# Patient Record
Sex: Male | Born: 1995 | Race: White | Hispanic: No | Marital: Single | Smoking: Never smoker
Health system: Southern US, Community
[De-identification: ages and names within clinical notes are randomized; demographics above are authoritative.]

## PROBLEM LIST (undated history)

## (undated) DIAGNOSIS — F845 Asperger's syndrome: Secondary | ICD-10-CM

## (undated) HISTORY — PX: ABDOMINAL SURGERY: SHX537

---

## 2017-06-18 ENCOUNTER — Emergency Department (HOSPITAL_COMMUNITY)
Admission: EM | Admit: 2017-06-18 | Discharge: 2017-06-18 | Disposition: A | Discharge status: Home / Self Care | Attending: Emergency Medicine | Admitting: Emergency Medicine

## 2017-06-18 ENCOUNTER — Encounter (HOSPITAL_COMMUNITY): Payer: Self-pay

## 2017-06-18 ENCOUNTER — Emergency Department (HOSPITAL_COMMUNITY)

## 2017-06-18 DIAGNOSIS — F845 Asperger's syndrome: Secondary | ICD-10-CM | POA: Insufficient documentation

## 2017-06-18 DIAGNOSIS — F341 Dysthymic disorder: Secondary | ICD-10-CM | POA: Diagnosis not present

## 2017-06-18 DIAGNOSIS — R222 Localized swelling, mass and lump, trunk: Secondary | ICD-10-CM | POA: Diagnosis present

## 2017-06-18 DIAGNOSIS — M954 Acquired deformity of chest and rib: Secondary | ICD-10-CM | POA: Diagnosis not present

## 2017-06-18 HISTORY — DX: Asperger's syndrome: F84.5

## 2017-06-18 NOTE — ED Notes (Signed)
Patient transported to X-ray 

## 2017-06-18 NOTE — ED Provider Notes (Signed)
[facility]-EMERGENCY DEPT Provider Note   CSN: [id number] Arrival date & time: 06/18/17  1422     History   Chief Complaint Chief Complaint  Patient presents with  . Knot on chest    HPI Reginald Ross is a 21 y.o. male.  HPI Patient presents was a deformity of his right chest wall. Has been they're likely for months. Patient's family member states she just noticed it. Does not hurt normally but does remember depressed. Patient denies any trauma. No difficult breathing. No fevers. Patient told me there was no trauma but reportedly had fallen onto his chest previously. Past Medical History:  Diagnosis Date  . Asperger syndrome     There are no active problems to display for this patient.   Past Surgical History:  Procedure Laterality Date  . ABDOMINAL SURGERY     muscle surgery as an infant       Home Medications    Prior to Admission medications   Medication Sig Start Date End Date Taking? Authorizing Provider  anastrozole (ARIMIDEX) 1 MG tablet Take 1 mg by mouth daily.   Yes [provider]    Family History No family history on file.  Social History Social History  Substance Use Topics  . Smoking status: Not on file  . Smokeless tobacco: Not on file  . Alcohol use Not on file     Allergies   Patient has no allergy information on record.   Review of Systems Review of Systems  Constitutional: Negative for appetite change.  Respiratory: Negative for shortness of breath and wheezing.   Cardiovascular: Negative for chest pain, palpitations and leg swelling.  Gastrointestinal: Negative for abdominal pain.  Skin: Negative for wound.     Physical Exam Updated Vital Signs BP 114/81 (BP Location: Right Arm)   Pulse 86   Temp 98.7 F (37.1 C) (Oral)   Resp 17   Ht 5\' 5"  (1.651 m)   Wt 53.5 kg (118 lb)   SpO2 96%   BMI 19.64 kg/m   Physical Exam  Constitutional: He appears well-developed.  HENT:  Head: Atraumatic.  Eyes: EOM are  normal.  Cardiovascular: Normal rate.   Pulmonary/Chest:  Prominence of the right parasternal area. May have some mild depression lateral to this. No crepitance. No subcutaneous emphysema. Not unstable. Slight abrasion possibly over the prominent area.  Abdominal: There is no tenderness.  Musculoskeletal: He exhibits no tenderness.  Neurological: He is alert.  Skin: Skin is warm.     ED Treatments / Results  Labs (all labs ordered are listed, but only abnormal results are displayed) Labs Reviewed - No data to display  EKG  EKG Interpretation None       Radiology Dg Chest 2 View  Result Date: 06/18/2017 CLINICAL DATA:  Status post fall on chest. Raised area in the right chest. EXAM: CHEST  2 VIEW COMPARISON:  None. FINDINGS: The heart size and mediastinal contours are within normal limits. Both lungs are clear. The visualized skeletal structures are unremarkable. IMPRESSION: No active cardiopulmonary disease. Electronically Signed   By: Sherian Rein M.D.   On: 06/18/2017 15:52   Dg Ribs Unilateral W/chest Right  Result Date: 06/18/2017 CLINICAL DATA:  Status post fall on chest. Raised area in the right chest. EXAM: RIGHT RIBS AND CHEST - 3+ VIEW COMPARISON:  None. FINDINGS: No fracture or other bone lesions are seen involving the ribs. There is no evidence of pneumothorax or pleural effusion. Both lungs are clear. Heart size and  mediastinal contours are within normal limits. IMPRESSION: Negative. Electronically Signed   By: Sherian ReinWei-Chen  Lin M.D.   On: 06/18/2017 15:53    Procedures Procedures (including critical care time)  Medications Ordered in ED Medications - No data to display   Initial Impression / Assessment and Plan / ED Course  I have reviewed the triage vital signs and the nursing notes.  Pertinent labs & imaging results that were available during my care of the patient were reviewed by me and considered in my medical decision making (see chart for details).      Patient with somewhat asymmetric bony prominence on his chest. X-ray reassuring. He is on some hormonal treatment to keep his growth plates open. Will need to follow-up with his PCP. Doubt acute change but does need following.  Final Clinical Impressions(s) / ED Diagnoses   Final diagnoses:  Chest wall deformity    New Prescriptions Discharge Medication List as of 06/18/2017  4:43 PM       Benjiman CorePickering, Kaylin Schellenberg, MD 06/19/17 0006

## 2017-06-18 NOTE — ED Notes (Signed)
Pt returned from xray. nad 

## 2017-06-18 NOTE — Discharge Instructions (Signed)
Follow-up with his doctor.

## 2017-06-18 NOTE — ED Triage Notes (Signed)
Pt and mother report raised area to right chest area. Mother noticed it this morning. Pt denies pain. No SOB. Breath sounds audible bilaterally. Pt reports he tripped on the stairs and fell on chest

## 2018-10-12 IMAGING — DX DG RIBS W/ CHEST 3+V*R*
2 series · 2 of 2 positions shown · non-contrast
Comparison: None.

CLINICAL DATA: Status post fall on chest. Raised area in the right
chest.

EXAM:
RIGHT RIBS AND CHEST - 3+ VIEW

[rib pa]
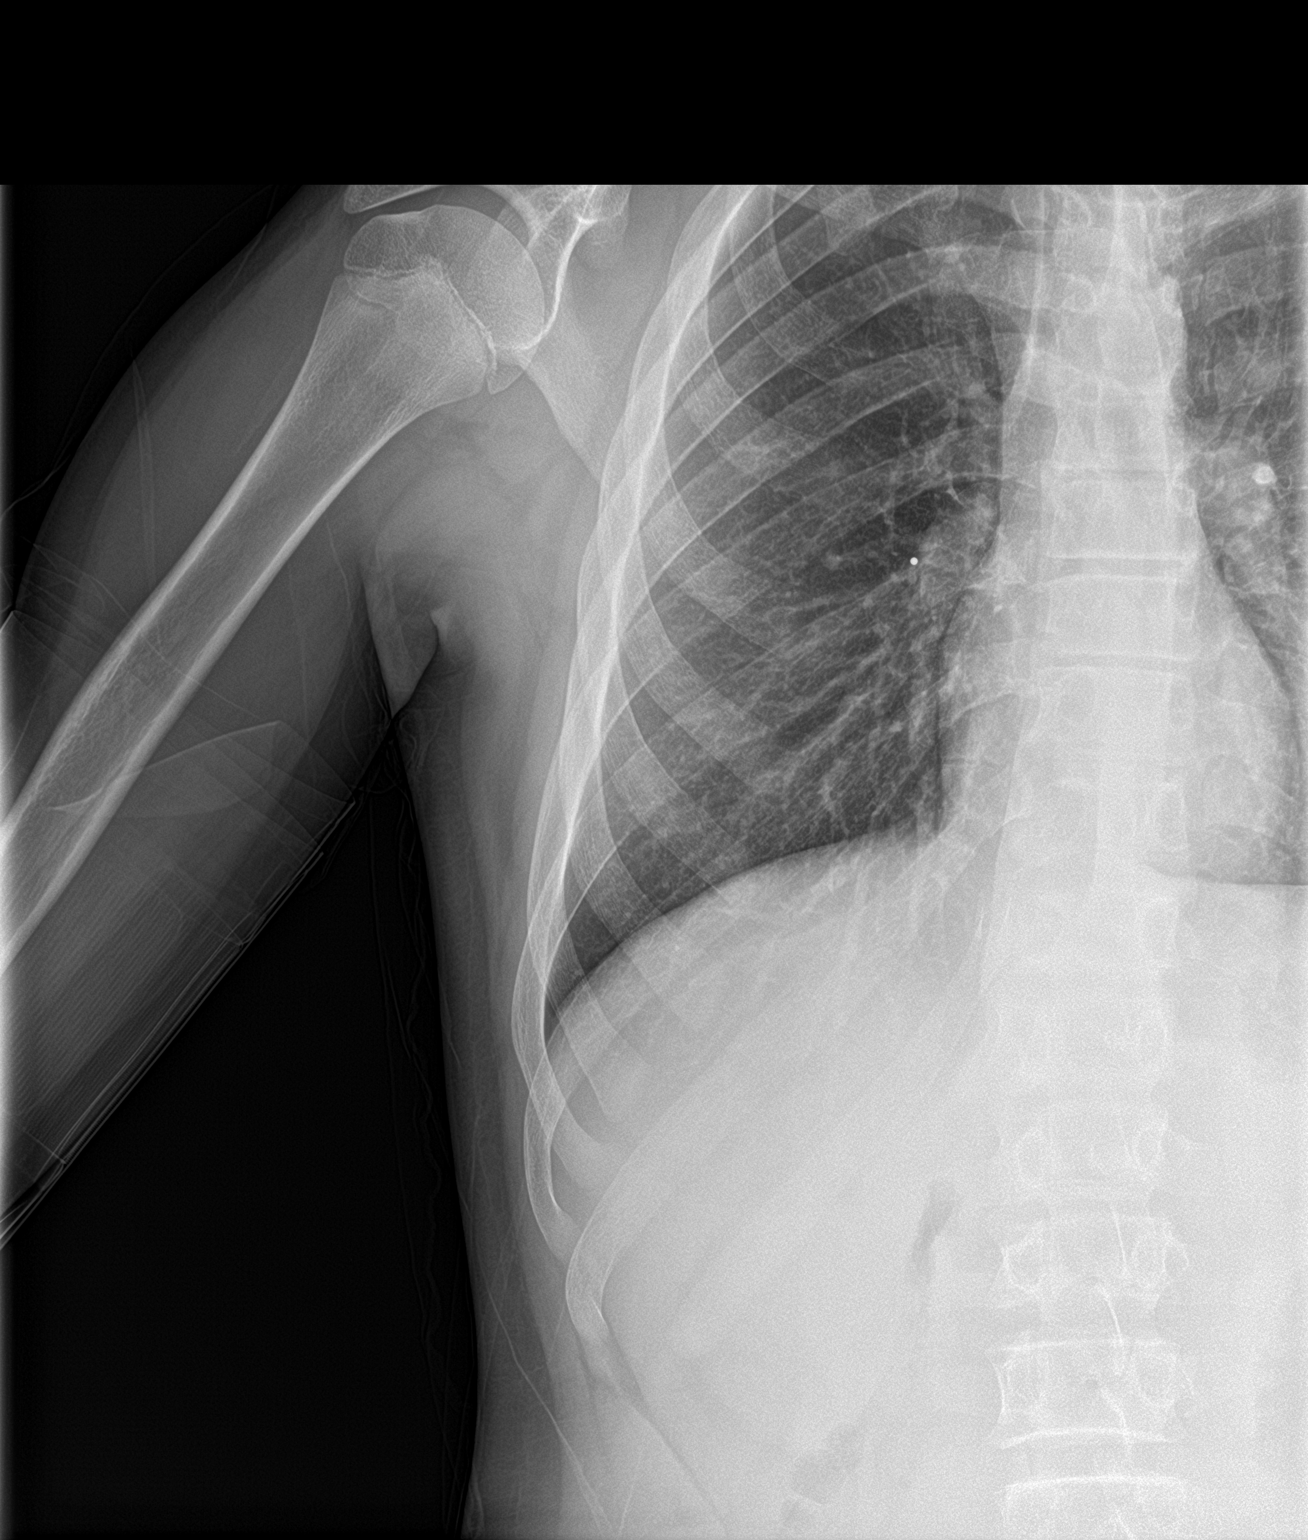

[rib pa obl]
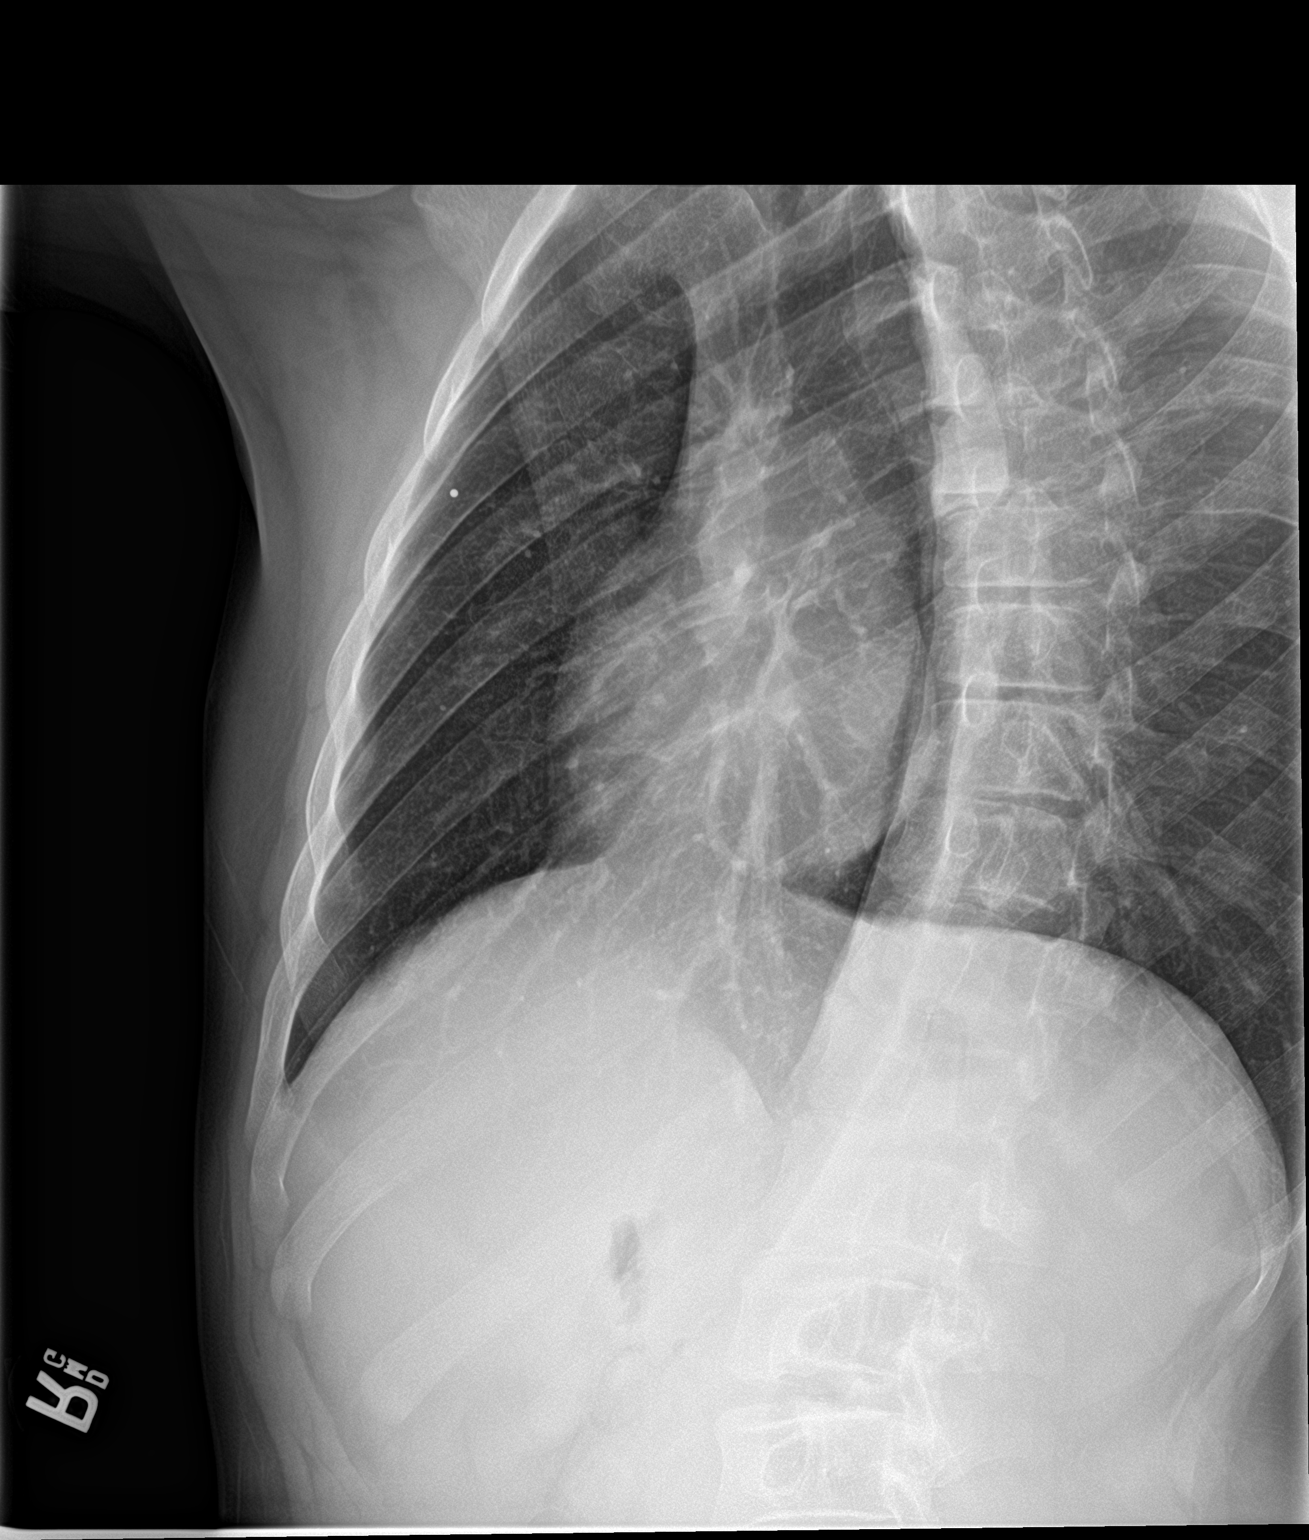

[2 of 2 positions shown; findings below may reference images not displayed]

FINDINGS: No fracture or other bone lesions are seen involving the ribs. There
is no evidence of pneumothorax or pleural effusion. Both lungs are
clear. Heart size and mediastinal contours are within normal limits.
IMPRESSION: Negative.

## 2020-11-09 ENCOUNTER — Encounter (HOSPITAL_COMMUNITY): Payer: Self-pay

## 2020-11-09 ENCOUNTER — Other Ambulatory Visit: Payer: Self-pay

## 2020-11-09 ENCOUNTER — Emergency Department (HOSPITAL_COMMUNITY): Payer: Medicaid Other

## 2020-11-09 ENCOUNTER — Emergency Department (HOSPITAL_COMMUNITY)
Admission: EM | Admit: 2020-11-09 | Discharge: 2020-11-09 | Disposition: A | Discharge status: Home / Self Care | Payer: Medicaid Other | Attending: Emergency Medicine | Admitting: Emergency Medicine

## 2020-11-09 DIAGNOSIS — W000XXA Fall on same level due to ice and snow, initial encounter: Secondary | ICD-10-CM | POA: Insufficient documentation

## 2020-11-09 DIAGNOSIS — S72424A Nondisplaced fracture of lateral condyle of right femur, initial encounter for closed fracture: Secondary | ICD-10-CM | POA: Insufficient documentation

## 2020-11-09 DIAGNOSIS — M25461 Effusion, right knee: Secondary | ICD-10-CM

## 2020-11-09 DIAGNOSIS — S79921A Unspecified injury of right thigh, initial encounter: Secondary | ICD-10-CM | POA: Diagnosis present

## 2020-11-09 MED ORDER — HYDROCODONE-ACETAMINOPHEN 5-325 MG PO TABS: 1.0000 | ORAL_TABLET | Freq: Four times a day (QID) | ORAL | 0 refills | Status: DC | PRN

## 2020-11-09 NOTE — ED Triage Notes (Signed)
Pt reports he slipped in ice this morning and hurt right knee

## 2020-11-09 NOTE — ED Provider Notes (Addendum)
[facility] EMERGENCY DEPARTMENT Provider Note   CSN: [id number] Arrival date & time: 11/09/20  1212     History Chief Complaint  Patient presents with  . Knee Pain    Reginald Ross is a 25 y.o. male presenting for evaluation of right knee pain and swelling from injury incurred when he slipped on ice this am.  He denies other injury including no head injury. Denies direct knee blow, but "twisted" it during the fall. He was able to bear weight initially but with substantial pain, pain now improved but with increased swelling.  He has employed and elevation prior to arrival.  Denies hip or ankle/foot pain, weakness or numbness.  Denies prior fracture or injuries to this knee.  HPI     Past Medical History:  Diagnosis Date  . Asperger syndrome     There are no problems to display for this patient.   Past Surgical History:  Procedure Laterality Date  . ABDOMINAL SURGERY     muscle surgery as an infant       No family history on file.     Home Medications Prior to Admission medications   Medication Sig Start Date End Date Taking? Authorizing Provider  HYDROcodone-acetaminophen (NORCO/VICODIN) 5-325 MG tablet Take 1 tablet by mouth every 6 (six) hours as needed for moderate pain. 11/09/20  Yes Daaiyah Baumert, Raynelle Fanning, PA-C  anastrozole (ARIMIDEX) 1 MG tablet Take 1 mg by mouth daily.    [provider]    Allergies    Patient has no known allergies.  Review of Systems   Review of Systems  Constitutional: Negative for fever.  Musculoskeletal: Positive for arthralgias and joint swelling. Negative for myalgias.  Neurological: Negative for weakness and numbness.  All other systems reviewed and are negative.   Physical Exam Updated Vital Signs BP (!) 141/88 (BP Location: Right Arm)   Pulse 97   Temp 98.2 F (36.8 C) (Oral)   Resp 18   Ht 5\' 8"  (1.727 m)   SpO2 99%   BMI 17.94 kg/m   Physical Exam Constitutional:      Appearance: He is well-developed and  well-nourished.  HENT:     Head: Atraumatic.  Cardiovascular:     Comments: Pulses equal bilaterally Musculoskeletal:        General: Swelling and tenderness present. No deformity.     Cervical back: Normal range of motion.     Right knee: Effusion and bony tenderness present. No erythema or ecchymosis.     Right lower leg: No edema.     Left lower leg: No edema.     Comments: ttp right lateral patella. No palpable deformity.  Moderate effusion.  Pt tolerates flexion to 90, passive extension to near full extension.  No crepitus. Skin intact. Ankle non tender including lateral malleolus.  Skin:    General: Skin is warm and dry.  Neurological:     Mental Status: He is alert.     Sensory: No sensory deficit.     Deep Tendon Reflexes: Strength normal. Reflexes normal.  Psychiatric:        Mood and Affect: Mood and affect normal.     ED Results / Procedures / Treatments   Labs (all labs ordered are listed, but only abnormal results are displayed) Labs Reviewed - No data to display  EKG None  Radiology DG Knee Complete 4 Views Right  Result Date: 11/09/2020 CLINICAL DATA:  Anterior right knee pain after fall EXAM: RIGHT KNEE - COMPLETE 4+ VIEW  COMPARISON:  None. FINDINGS: Large knee joint effusion. Contour deformity of the peripheral aspect of the lateral femoral condyle with suspected cortical fracture. Remaining osseous structures appear intact. Soft tissue swelling at the anterior aspect of the knee. IMPRESSION: 1. Contour deformity with suspected cortical fracture of the peripheral aspect of the lateral femoral condyle. Appearance and location raises suspicion suspicious of the transient patellar dislocation. 2. Large knee joint effusion, possibly hemarthrosis. Electronically Signed   By: Duanne Guess D.O.   On: 11/09/2020 14:21    Procedures Procedures   Medications Ordered in ED Medications - No data to display  ED Course  I have reviewed the triage vital signs and the  nursing notes.  Pertinent labs & imaging results that were available during my care of the patient were reviewed by me and considered in my medical decision making (see chart for details).    MDM Rules/Calculators/A&P                          Imaging reviewed and discussed with pt.  Discussed ice, elevation, strict immobilization and close f/u with Dr Romeo Apple. Placed in knee immobilizer, crutches provided.  Ibuprofen, hydrocodone prn. Pt not currently employed or working.  Final Clinical Impression(s) / ED Diagnoses Final diagnoses:  Closed nondisplaced fracture of lateral condyle of right femur, initial encounter (HCC)  Knee effusion, right    Rx / DC Orders ED Discharge Orders         Ordered    HYDROcodone-acetaminophen (NORCO/VICODIN) 5-325 MG tablet  Every 6 hours PRN        11/09/20 1501           IdolRaynelle Fanning, PA-C 11/09/20 1513    At dc, pt unable to tolerate using crutches, did not feel safe on them.  Walker prescription given.   Burgess Amor, PA-C 11/09/20 1746    Mancel Bale, MD 11/10/20 [phone]

## 2020-11-09 NOTE — Discharge Instructions (Signed)
As discussed, it appears you have fracture of the distal outer edge of your right femur bone.  This does not appear to be an injury that would require surgery but you will need to wear the Knee immobilizer at all times along with avoiding any weight bearing of this leg by using crutches until guided further by Dr. Romeo Apple.  Call his office for an appointment for close follow up of this injury.  In the interim,  avoid bending and flexing the knee at all times(the more it moves, the more the swelling will worsen).  Ice and ibuprofen will also help with swelling.  Take 3 ibuprofen tablets (600 mg total) every 8 hours with a snack.  You may take the hydrocodone  prescribed if needed for additional pain relief.  This will make you drowsy - do not drive within 4 hours of taking this medication.

## 2020-11-17 ENCOUNTER — Ambulatory Visit (INDEPENDENT_AMBULATORY_CARE_PROVIDER_SITE_OTHER): Payer: Medicaid Other | Admitting: Orthopedic Surgery

## 2020-11-17 ENCOUNTER — Other Ambulatory Visit: Payer: Self-pay

## 2020-11-17 ENCOUNTER — Encounter: Payer: Self-pay | Admitting: Orthopedic Surgery

## 2020-11-17 VITALS — BP 144/92 | HR 105 | Ht 68.0 in

## 2020-11-17 DIAGNOSIS — M25561 Pain in right knee: Secondary | ICD-10-CM

## 2020-11-17 DIAGNOSIS — M25461 Effusion, right knee: Secondary | ICD-10-CM | POA: Diagnosis not present

## 2020-11-17 DIAGNOSIS — W000XXA Fall on same level due to ice and snow, initial encounter: Secondary | ICD-10-CM

## 2020-11-17 DIAGNOSIS — S72424A Nondisplaced fracture of lateral condyle of right femur, initial encounter for closed fracture: Secondary | ICD-10-CM

## 2020-11-17 NOTE — Patient Instructions (Signed)
Ice 3 -4 x a day for 30 min   We will order MRI of the knee

## 2020-11-17 NOTE — Progress Notes (Signed)
NEW PROBLEM//OFFICE VISIT  Summary assessment and plan:   25 year old male with a knee effusion after a fall questionable fracture of his lateral femoral condyle  Recommend MRI to evaluate ligaments ACL PCL medial collateral ligament and medial meniscus  Chief Complaint  Patient presents with  . Knee Pain    Right     25 year old male slipped on ice around November 09, 2020 complained of immediate pain and swelling of his right knee.  X-rays were done in the ER possible fracture lateral condyle  Patient complains of pain and swelling decreased range of motion and now he has to use a walker   Review of Systems  All other systems reviewed and are negative.    Past Medical History:  Diagnosis Date  . Asperger syndrome     Past Surgical History:  Procedure Laterality Date  . ABDOMINAL SURGERY     muscle surgery as an infant    History reviewed. No pertinent family history. Social History   Tobacco Use  . Smoking status: Never Smoker  . Smokeless tobacco: Never Used    No Known Allergies  Current Meds  Medication Sig  . anastrozole (ARIMIDEX) 1 MG tablet Take 1 mg by mouth daily.  . citalopram (CELEXA) 10 MG tablet Take 10 mg by mouth daily.  Marland Kitchen HYDROcodone-acetaminophen (NORCO/VICODIN) 5-325 MG tablet Take 1 tablet by mouth every 6 (six) hours as needed for moderate pain.    BP (!) 144/92   Pulse (!) 105   Ht 5\' 8"  (1.727 m)   BMI 17.94 kg/m   Physical Exam Patient is here with his mother is awake alert and oriented mood and affect are normal Right knee exam Large effusion Range of motion arc is 60-90 Passive range of motion is 30-100 Drawer tests are equivocal because of pain and swelling Neurovascular exam is intact skin is normal    MEDICAL DECISION MAKING  A.  Encounter Diagnoses  Name Primary?  . Effusion, right knee Yes  . Acute pain of right knee   . Closed nondisplaced fracture of lateral condyle of right femur, initial encounter (HCC)      B. DATA ANALYSED:   IMAGING: Interpretation of images: External: 4 views right knee possible cortical break lateral femoral condyle near the insertion of the popliteus and collateral ligament  Orders: MRI right knee  Outside records reviewed: ER records   C. MANAGEMENT   Hinged knee brace weight-bear as tolerated with walker ice ibuprofen  Return after MRI  No orders of the defined types were placed in this encounter.     , MD  11/17/2020 12:39 PM

## 2020-11-22 ENCOUNTER — Other Ambulatory Visit: Payer: Self-pay | Admitting: Orthopedic Surgery

## 2020-11-22 DIAGNOSIS — M25561 Pain in right knee: Secondary | ICD-10-CM

## 2020-11-28 ENCOUNTER — Other Ambulatory Visit: Payer: Self-pay

## 2020-11-28 ENCOUNTER — Ambulatory Visit (HOSPITAL_COMMUNITY)
Admission: RE | Admit: 2020-11-28 | Discharge: 2020-11-28 | Disposition: A | Discharge status: Home / Self Care | Payer: Medicaid Other | Source: Ambulatory Visit | Attending: Orthopedic Surgery | Admitting: Orthopedic Surgery

## 2020-11-28 DIAGNOSIS — M25561 Pain in right knee: Secondary | ICD-10-CM | POA: Diagnosis present

## 2020-12-20 ENCOUNTER — Other Ambulatory Visit: Payer: Self-pay

## 2020-12-20 ENCOUNTER — Encounter: Payer: Self-pay | Admitting: Orthopedic Surgery

## 2020-12-20 ENCOUNTER — Ambulatory Visit (INDEPENDENT_AMBULATORY_CARE_PROVIDER_SITE_OTHER): Payer: Medicaid Other | Admitting: Orthopedic Surgery

## 2020-12-20 VITALS — Ht 68.0 in

## 2020-12-20 DIAGNOSIS — S83004D Unspecified dislocation of right patella, subsequent encounter: Secondary | ICD-10-CM | POA: Diagnosis not present

## 2020-12-20 NOTE — Progress Notes (Signed)
Chief Complaint  Patient presents with  . Knee Injury    Right 11/09/20    25 year old male slipped on ice around January 25th injured his right knee  He was sent for MRI secondary to initial images showing a possible fracture of the lateral condyle of the femur  So now we are 6 weeks post injury  Adriel has gotten significantly better he can move his knee freely but the knee has significantly decreased and.  He has some pain when he standing for long periods of time  The knee has significant less swelling about a range of motion  MRI personal interpretation after reviewing the images MRI images show impaction fractures patella and femur in the characteristic locations medially and laterally respectively with medial patellofemoral ligament thickening suggesting incomplete tearing  Currently his knee is stable he can stay in the brace for another 6 weeks ice at night follow-up in 6 weeks   Radiology official report IMPRESSION: 1. Sequelae of recent transient lateral patellar dislocation with associated impaction fractures of the medial patella and peripheral lateral femoral condyle. 2. Sprain of the MPFL and medial patellar retinaculum. 3. Small hemarthrosis.     Electronically Signed   By: Obie Dredge M.D.   On: 11/28/2020 15:39

## 2020-12-20 NOTE — Patient Instructions (Addendum)
IMPACTION FRACTURE PATELLA AND FEMUR AND PATELLA LIGAMENT INJURY   NO SURGERY NEEDED BUT 6 MORE WEEKS OF BRACE WEAR WHEN OUT OF THE HOUSE   ICE FOR 30 MIN AT NIGHT    Patellar Dislocation A kneecap (patella) becomes dislocated when the kneecap slips fully out of its normal position. This can cause pain and swelling. If the kneecap slips only partly out of its normal position, it is called patellar subluxation. Follow these instructions at home: If you have a brace:  Wear it as told by your doctor. Take it off only as told by your doctor.  Loosen the brace if your toes tingle, become numb, or turn cold and blue.  Do not let your brace get wet if it is not waterproof.  Keep the brace clean.  If your brace is not waterproof, cover it with a watertight covering when you take a bath or a shower.   Managing pain, stiffness, and swelling  If directed, put ice on the injured area. ? If you have a removable brace, take it off as told by your doctor. ? Put ice in a plastic bag. ? Place a towel between your skin and the bag. ? Leave the ice on for 20 minutes, 2-3 times a day.  Move your toes often to avoid stiffness and to lessen swelling. Activity  Go back to your normal activities as told by your doctor. Ask your doctor what activities are safe for you.  Do exercises as told by your doctor. General instructions  Do not use the injured limb to support your body weight until your doctor says that you can. Use crutches as told by your doctor.  Take over-the-counter and prescription medicines only as told by your doctor.  Keep all follow-up visits as told by your doctor. This is important. Prevention  Warm up and stretch before starting any physical activity.  Cool down and stretch after being active.  Give your body time to rest between periods of activity.  Make sure to use equipment that fits you.  Protect yourself against falls and injuries by doing activities in a safe  way. Contact a doctor if:  Your pain or swelling does not get better. Get help right away if:  The pain in your knee gets worse and is not helped by medicine.  You have more warmth or redness (inflammation) of the knee.  You have stiffness or your knee gets stuck (locks) in one position.  You cannot bend your knee.  You have new swelling, pain, or tenderness in any part of the affected leg. Summary  A kneecap becomes dislocated when the kneecap slips fully out of its normal position.  Icing, medicine, and using a knee brace may help. Follow instructions as told by your doctor. This information is not intended to replace advice given to you by your health care provider. Make sure you discuss any questions you have with your health care provider. Document Revised: 06/04/2020 Document Reviewed: 06/04/2020 Elsevier Patient Education  2021 [employer].

## 2021-02-03 ENCOUNTER — Ambulatory Visit: Payer: Medicaid Other | Admitting: Orthopedic Surgery

## 2021-02-03 ENCOUNTER — Ambulatory Visit (INDEPENDENT_AMBULATORY_CARE_PROVIDER_SITE_OTHER): Payer: Medicaid Other | Admitting: Orthopedic Surgery

## 2021-02-03 ENCOUNTER — Encounter: Payer: Self-pay | Admitting: Orthopedic Surgery

## 2021-02-03 ENCOUNTER — Other Ambulatory Visit: Payer: Self-pay

## 2021-02-03 VITALS — BP 119/77 | HR 84 | Ht 70.0 in | Wt 203.0 lb

## 2021-02-03 DIAGNOSIS — S83004D Unspecified dislocation of right patella, subsequent encounter: Secondary | ICD-10-CM | POA: Diagnosis not present

## 2021-02-03 NOTE — Progress Notes (Signed)
Chief Complaint  Patient presents with  . Knee Pain    Right s/p patella dislocation/ improving                   No complaints today  Physical Exam Constitutional:      Appearance: Normal appearance.  Musculoskeletal:        General: No swelling, tenderness or deformity.     Right lower leg: No edema.     Comments: Right knee  Neurological:     Mental Status: He is alert.    Ok to d/c   Brace as needed for heavy activity   Last visit: 25 year old male slipped on ice around January 25th injured his right knee   He was sent for MRI secondary to initial images showing a possible fracture of the lateral condyle of the femur   So now we are 6 weeks post injury   Oakes has gotten significantly better he can move his knee freely but the knee has significantly decreased and.  He has some pain when he standing for long periods of time   The knee has significant less swelling about a range of motion   MRI personal interpretation after reviewing the images MRI images show impaction fractures patella and femur in the characteristic locations medially and laterally respectively with medial patellofemoral ligament thickening suggesting incomplete tearing

## 2022-03-24 ENCOUNTER — Ambulatory Visit: Payer: Medicaid Other | Admitting: Family Medicine

## 2022-03-24 IMAGING — MR MR KNEE*R* W/O CM
6 series · 40 of 40 positions shown · non-contrast
Comparison: Right knee x-rays dated November 09, 2020.

CLINICAL DATA: Anterior right knee pain after a fall 3 weeks ago.

EXAM:
MRI OF THE RIGHT KNEE WITHOUT CONTRAST
TECHNIQUE: Multiplanar, multisequence MR imaging of the knee was performed. No
intravenous contrast was administered.

[Series 12: T2 fat-sat · axial · right · 4.0mm · 0.36mm/px · z∈[-148,-24]mm · 6 of 26 slices shown (1 of 3)]
[im 1/26]
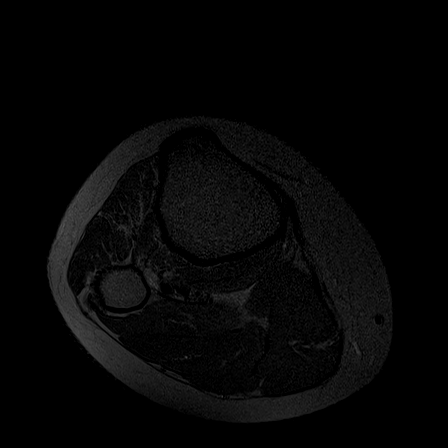
[im 6/26]
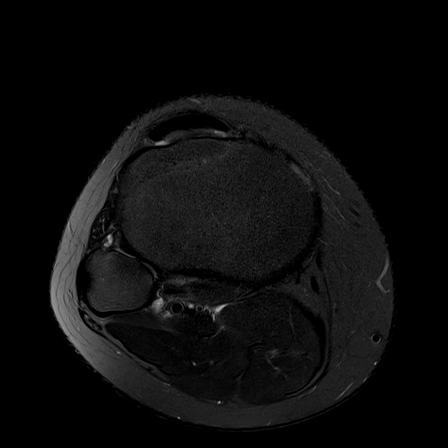
[im 11/26]
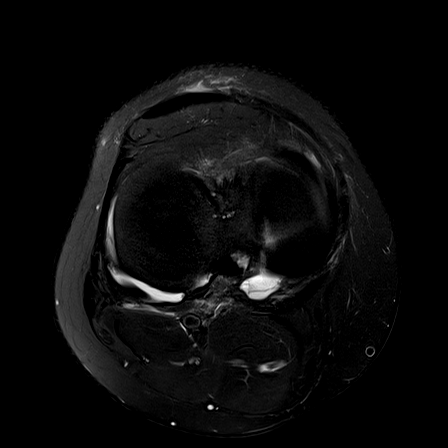
[im 16/26]
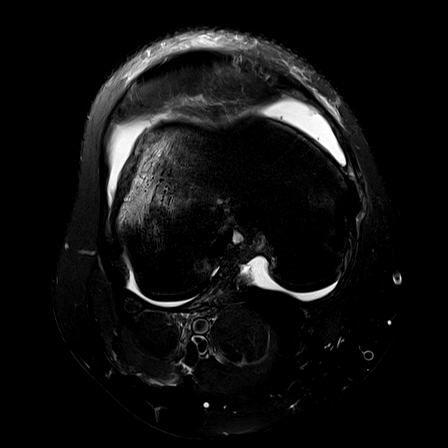
[im 21/26]
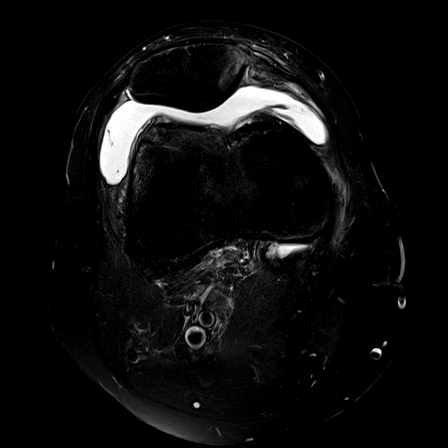
[im 26/26]
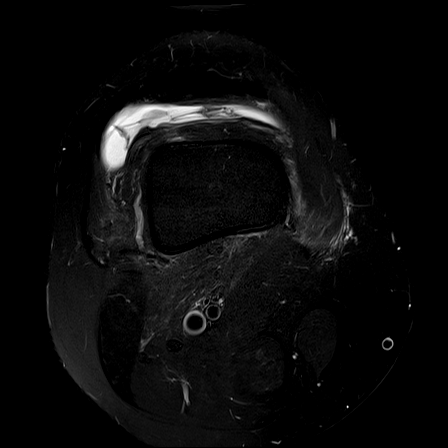

[Series 13: T1 · coronal · right · 4.0mm · 0.49mm/px · 7 of 30 slices shown]
[im 1/30]
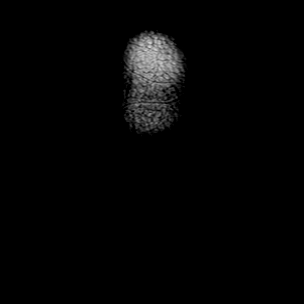
[im 5/30]
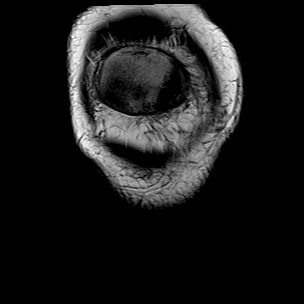
[im 10/30]
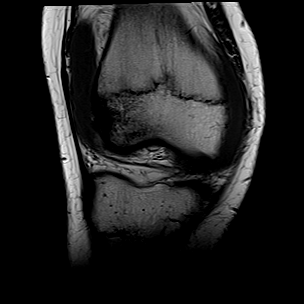
[im 15/30]
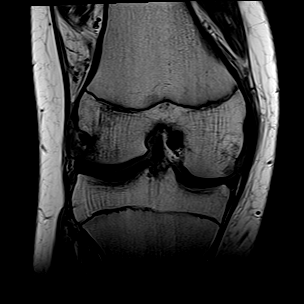
[im 20/30]
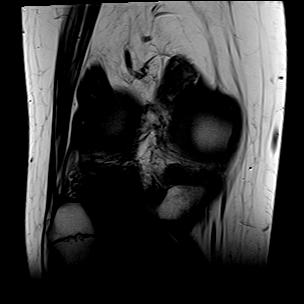
[im 25/30]
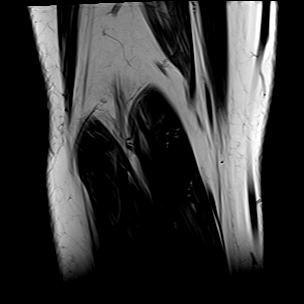
[im 30/30]
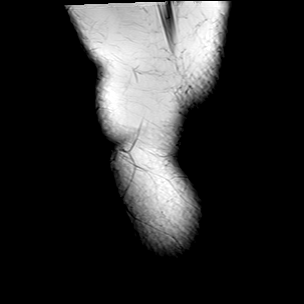

[Series 14: T2 fat-sat · coronal · right · 4.0mm · 0.59mm/px · 6 of 30 slices shown (2 of 3)]
[im 1/30]
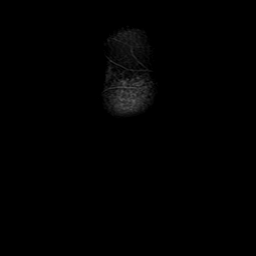
[im 6/30]
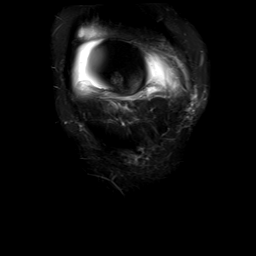
[im 12/30]
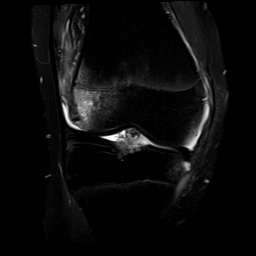
[im 18/30]
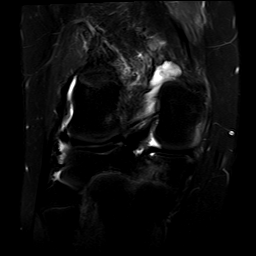
[im 24/30]
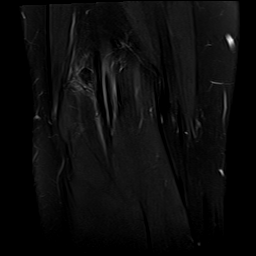
[im 30/30]
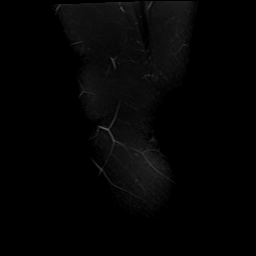

[Series 15: PD fat-sat · coronal · right · 3.0mm · 0.44mm/px · 7 of 36 slices shown (1 of 2)]
[im 1/36]
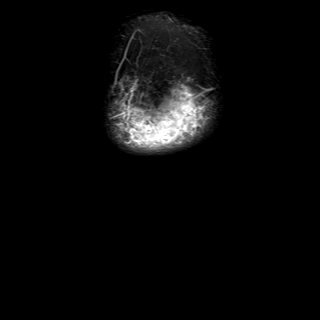
[im 6/36]
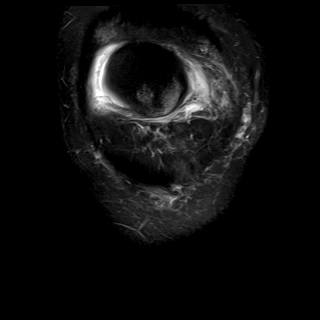
[im 12/36]
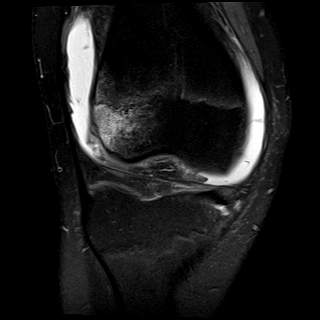
[im 18/36]
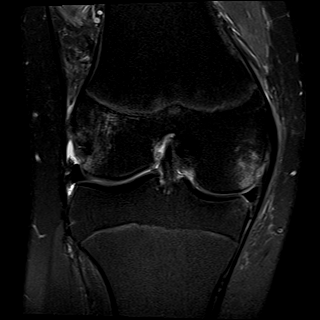
[im 24/36]
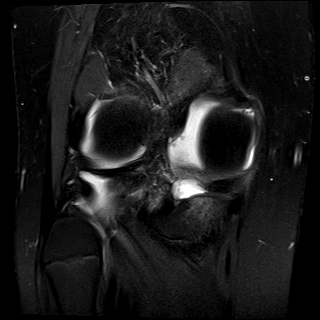
[im 30/36]
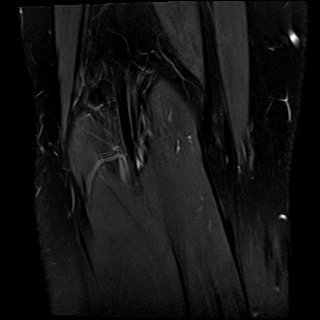
[im 36/36]
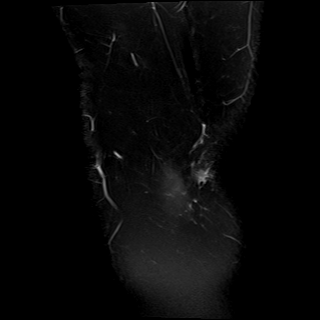

[Series 16: PD fat-sat · sagittal · right · 3.0mm · 0.52mm/px · 7 of 36 slices shown (2 of 2)]
[im 1/36]
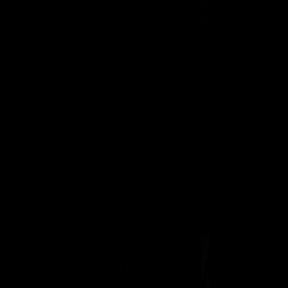
[im 6/36]
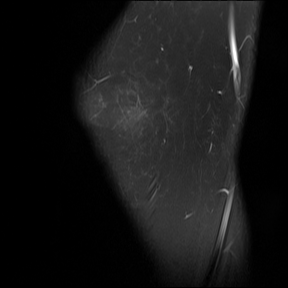
[im 12/36]
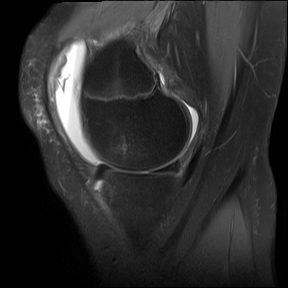
[im 18/36]
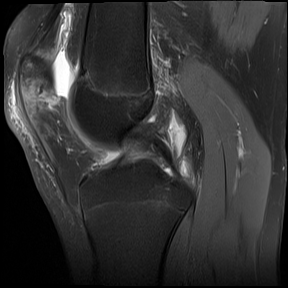
[im 24/36]
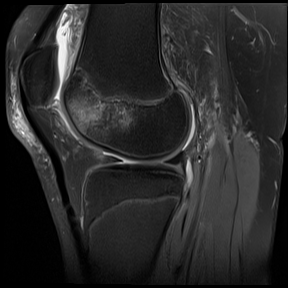
[im 30/36]
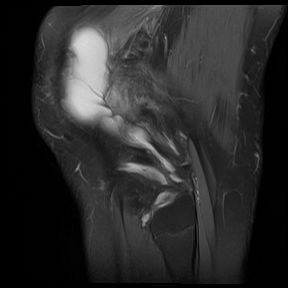
[im 36/36]
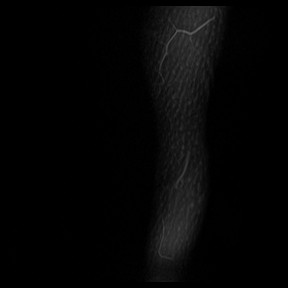

[Series 17: T2 fat-sat · sagittal · right · 3.0mm · 0.52mm/px · 7 of 36 slices shown (3 of 3)]
[im 1/36]
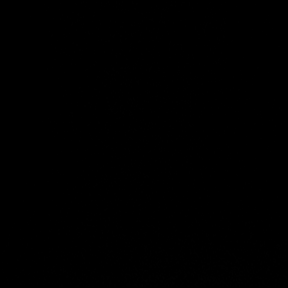
[im 6/36]
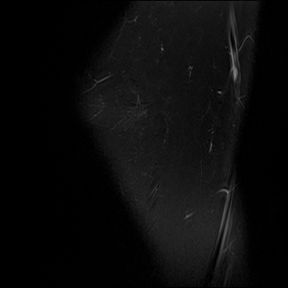
[im 12/36]
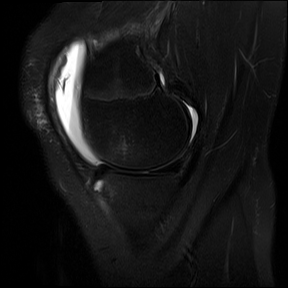
[im 18/36]
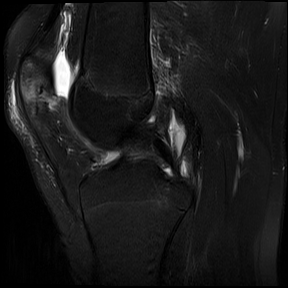
[im 24/36]
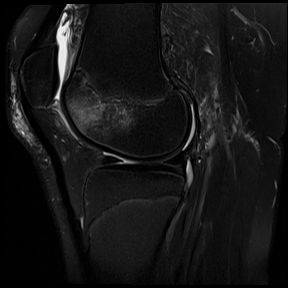
[im 30/36]
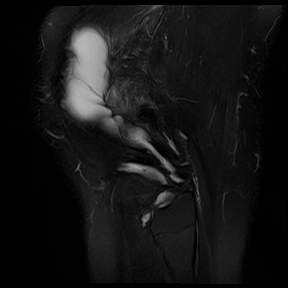
[im 36/36]
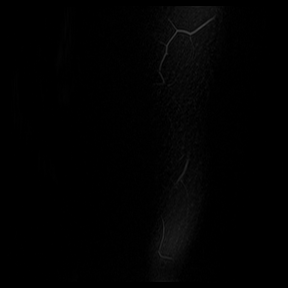

[40 of 40 positions shown; findings below may reference images not displayed]

FINDINGS: MENISCI

Medial meniscus:  Intact.

Lateral meniscus:  Intact.

LIGAMENTS

Cruciates:  Intact ACL and PCL.

Collaterals: Medial collateral ligament is intact. Lateral
collateral ligament complex is intact.

CARTILAGE

Patellofemoral: Focal full-thickness fissuring over the lateral
patellar facet (series 12, image 8).

Medial:  Normal.

Lateral:  Normal.

Joint: Small joint effusion with fluid-fluid level, consistent with
hemarthrosis. Edema in the superomedial aspect of Hoffa's fat.

Popliteal Fossa:  No Baker cyst. Intact popliteus tendon.

Extensor Mechanism: Intact quadriceps tendon and patellar tendon.
Thickening of the MPFL and medial patellar retinaculum with
increased intermediate signal and mild surrounding soft tissue
swelling. Intact lateral patellar retinaculum.

Bones: Acute impaction fractures of the peripheral lateral femoral
condyle and medial patella. Small contusions in the peripheral
medial femoral condyle and posterior medial tibial plateau. No
dislocation. No suspicious bone lesion.

Other: Mild prepatellar soft tissue swelling.
IMPRESSION: 1. Sequelae of recent transient lateral patellar dislocation with
associated impaction fractures of the medial patella and peripheral
lateral femoral condyle.
2. Sprain of the MPFL and medial patellar retinaculum.
3. Small hemarthrosis.

## 2022-03-30 ENCOUNTER — Telehealth: Payer: Self-pay | Admitting: *Deleted

## 2022-03-30 ENCOUNTER — Ambulatory Visit (INDEPENDENT_AMBULATORY_CARE_PROVIDER_SITE_OTHER): Payer: Medicaid Other | Admitting: Family Medicine

## 2022-03-30 DIAGNOSIS — F422 Mixed obsessional thoughts and acts: Secondary | ICD-10-CM

## 2022-03-30 DIAGNOSIS — F840 Autistic disorder: Secondary | ICD-10-CM

## 2022-03-30 MED ORDER — FLUOXETINE HCL 20 MG PO TABS: 20.0000 mg | ORAL_TABLET | Freq: Every day | ORAL | 0 refills | Status: DC

## 2022-03-30 NOTE — Patient Instructions (Signed)
Medication as directed.  Follow up in 6 weeks.  Take care  Dr. Adriana Simas

## 2022-03-30 NOTE — Telephone Encounter (Signed)
Medicaid will not cover Fluoxetine tablet but will cover Fluoxetine capsules- can the script be switched to capsules and resent to pharmacy- Please advise  [facility]

## 2022-03-31 DIAGNOSIS — F840 Autistic disorder: Secondary | ICD-10-CM | POA: Insufficient documentation

## 2022-03-31 DIAGNOSIS — F429 Obsessive-compulsive disorder, unspecified: Secondary | ICD-10-CM | POA: Insufficient documentation

## 2022-03-31 MED ORDER — FLUOXETINE HCL 20 MG PO CAPS: 20.0000 mg | ORAL_CAPSULE | Freq: Every day | ORAL | 0 refills | Status: DC

## 2022-03-31 NOTE — Telephone Encounter (Signed)
Everlene Other G, DO     Yes that is fine by me.

## 2022-03-31 NOTE — Assessment & Plan Note (Signed)
Patient is experiencing OCD.  Treating with fluoxetine.

## 2022-03-31 NOTE — Progress Notes (Signed)
Subjective:  Patient ID: Reginald Ross, male    DOB: 01/21/1996  Age: 26 y.o. MRN: [id number]  CC: Chief Complaint  Patient presents with   New Patient (Initial Visit)    Pt has anxiety issues    HPI:  26 year old male with a history of autism presents to establish care.  Patient states that he was encouraged to come to the doctor by his grandmother.  He lives with his grandmother.  He states that he has obsessive thoughts which are quite intrusive and interfere with his ability to do the things he likes.  He states that he will often have to repeat something until is done perfectly.  He states that he was told that this was anxiety.  He has been using Atarax without resolution.  He states that he is otherwise doing well.  He does state that these thoughts really bother him.  No other reported symptoms.  No other complaints.  Patient Active Problem List   Diagnosis Date Noted   Autism 03/31/2022   OCD (obsessive compulsive disorder) 03/31/2022    Social Hx   Social History   Socioeconomic History   Marital status: Single    Spouse name: Not on file   Number of children: Not on file   Years of education: Not on file   Highest education level: Not on file  Occupational History   Not on file  Tobacco Use   Smoking status: Never   Smokeless tobacco: Never  Substance and Sexual Activity   Alcohol use: Not on file   Drug use: Not on file   Sexual activity: Not on file  Other Topics Concern   Not on file  Social History Narrative   Not on file   Social Determinants of Health   Financial Resource Strain: Not on file  Food Insecurity: Not on file  Transportation Needs: Not on file  Physical Activity: Not on file  Stress: Not on file  Social Connections: Not on file    Review of Systems Per HPI  Objective:  BP 130/85   Pulse 68   Temp 97.6 F (36.4 C) (Oral)   Ht 5\' 10"  (1.778 m)   Wt 209 lb 9.6 oz (95.1 kg)   SpO2 100%   BMI 30.07 kg/m      03/30/2022    10:16 AM 02/03/2021    8:01 AM 11/17/2020    9:43 AM  BP/Weight  Systolic BP 130 119 144  Diastolic BP 85 77 92  Wt. (Lbs) 209.6 203   BMI 30.07 kg/m2 29.13 kg/m2     Physical Exam Vitals and nursing note reviewed.  Constitutional:      Appearance: He is obese.  HENT:     Head: Normocephalic and atraumatic.  Eyes:     General:        Right eye: No discharge.        Left eye: No discharge.     Conjunctiva/sclera: Conjunctivae normal.  Cardiovascular:     Rate and Rhythm: Normal rate and regular rhythm.     Heart sounds: No murmur heard. Pulmonary:     Effort: Pulmonary effort is normal.     Breath sounds: Normal breath sounds. No wheezing, rhonchi or rales.  Neurological:     General: No focal deficit present.     Mental Status: He is alert.    Assessment & Plan:   Problem List Items Addressed This Visit       Other   Autism  OCD (obsessive compulsive disorder)    Patient is experiencing OCD.  Treating with fluoxetine.      Relevant Medications   hydrOXYzine (ATARAX) 25 MG tablet    Follow-up:  Return in about 6 weeks (around 05/11/2022).  Everlene Other DO [facility]

## 2022-03-31 NOTE — Telephone Encounter (Signed)
New script sent electronically to pharmacy.

## 2022-04-03 ENCOUNTER — Telehealth: Payer: Self-pay

## 2022-04-03 NOTE — Telephone Encounter (Signed)
Pharmacy updated.

## 2022-04-03 NOTE — Telephone Encounter (Signed)
Did you mean to send this to [facility]? Patient is not seen by anyone at this office.

## 2022-04-03 NOTE — Telephone Encounter (Signed)
Caller name:Reginald Ross   On DPR? :Yes  Call back [phone]  Provider they see: Adriana Simas  Reason for call:Pt is calling updating pharmacy to [facility]

## 2022-05-22 ENCOUNTER — Encounter: Payer: Self-pay | Admitting: Family Medicine

## 2022-05-22 ENCOUNTER — Ambulatory Visit (INDEPENDENT_AMBULATORY_CARE_PROVIDER_SITE_OTHER): Payer: Medicaid Other | Admitting: Family Medicine

## 2022-05-22 DIAGNOSIS — F422 Mixed obsessional thoughts and acts: Secondary | ICD-10-CM

## 2022-05-22 MED ORDER — FLUOXETINE HCL 40 MG PO CAPS: 40.0000 mg | ORAL_CAPSULE | Freq: Every day | ORAL | 1 refills | Status: DC

## 2022-05-22 NOTE — Patient Instructions (Signed)
I have increased your medication.  Follow up in 3 months.  Take care  Dr. Adriana Simas

## 2022-05-22 NOTE — Assessment & Plan Note (Signed)
Seems to be improved but patient states he is still having difficulty.  Increasing Prozac.

## 2022-05-22 NOTE — Progress Notes (Signed)
   Subjective:  Patient ID: Reginald Ross, male    DOB: 1995-10-28  Age: 26 y.o. MRN: [id number]  CC: Chief Complaint  Patient presents with   Autism    Pt arrives for follow up. Pt states he is still repeating things in head for some reason.     HPI:  26 year old male with OCD presents for follow up.  Patient states he still has obsessive thoughts. Has had a slight improvement. However, clinically he does not seem to be as bother/flustered about these thoughts today. Compliant with Prozac. No reported side effects. No other complaints at this time.  Patient Active Problem List   Diagnosis Date Noted   Autism 03/31/2022   OCD (obsessive compulsive disorder) 03/31/2022    Social Hx   Social History   Socioeconomic History   Marital status: Single    Spouse name: Not on file   Number of children: Not on file   Years of education: Not on file   Highest education level: Not on file  Occupational History   Not on file  Tobacco Use   Smoking status: Never   Smokeless tobacco: Never  Substance and Sexual Activity   Alcohol use: Not on file   Drug use: Not on file   Sexual activity: Not on file  Other Topics Concern   Not on file  Social History Narrative   Not on file   Social Determinants of Health   Financial Resource Strain: Not on file  Food Insecurity: Not on file  Transportation Needs: Not on file  Physical Activity: Not on file  Stress: Not on file  Social Connections: Not on file    Review of Systems Per HPI  Objective:  BP 124/83   Pulse 78   Temp 97.9 F (36.6 C)   Wt 209 lb 6.4 oz (95 kg)   SpO2 99%   BMI 30.05 kg/m      05/22/2022   10:24 AM 03/30/2022   10:16 AM 02/03/2021    8:01 AM  BP/Weight  Systolic BP 124 130 119  Diastolic BP 83 85 77  Wt. (Lbs) 209.4 209.6 203  BMI 30.05 kg/m2 30.07 kg/m2 29.13 kg/m2    Physical Exam Vitals and nursing note reviewed.  Constitutional:      General: He is not in acute distress.     Appearance: Normal appearance. He is obese. He is not ill-appearing.  HENT:     Head: Normocephalic and atraumatic.  Pulmonary:     Effort: Pulmonary effort is normal. No respiratory distress.  Neurological:     Mental Status: He is alert.  Psychiatric:        Mood and Affect: Mood normal.        Behavior: Behavior normal.     Assessment & Plan:   Problem List Items Addressed This Visit       Other   OCD (obsessive compulsive disorder)    Seems to be improved but patient states he is still having difficulty.  Increasing Prozac.       Relevant Medications   FLUoxetine (PROZAC) 40 MG capsule    Meds ordered this encounter  Medications   FLUoxetine (PROZAC) 40 MG capsule    Sig: Take 1 capsule (40 mg total) by mouth daily.    Dispense:  90 capsule    Refill:  1    Follow-up:  Return in about 3 months (around 08/22/2022).  Everlene Other DO [facility]

## 2022-08-22 ENCOUNTER — Encounter: Payer: Self-pay | Admitting: Family Medicine

## 2022-08-22 ENCOUNTER — Ambulatory Visit (INDEPENDENT_AMBULATORY_CARE_PROVIDER_SITE_OTHER): Payer: Medicaid Other | Admitting: Family Medicine

## 2022-08-22 VITALS — BP 132/84 | HR 78 | Wt 206.8 lb

## 2022-08-22 DIAGNOSIS — F422 Mixed obsessional thoughts and acts: Secondary | ICD-10-CM | POA: Diagnosis not present

## 2022-08-22 MED ORDER — FLUOXETINE HCL 40 MG PO CAPS: 80.0000 mg | ORAL_CAPSULE | Freq: Every day | ORAL | 1 refills | Status: DC

## 2022-08-22 NOTE — Assessment & Plan Note (Signed)
Still having difficulty.  Uncontrolled.  Increasing Prozac.  Advised to follow-up with [facility].  Recommended counseling to help deal with obsessive/intrusive thoughts.

## 2022-08-22 NOTE — Patient Instructions (Signed)
Continue seeing [facility].  I have increased to the Prozac to help.  Follow up in 6 moths.

## 2022-08-22 NOTE — Progress Notes (Signed)
Subjective:  Patient ID: Reginald Ross, male    DOB: Feb 07, 1996  Age: 26 y.o. MRN: [id number]  CC: Chief Complaint  Patient presents with   Follow-up    Pt arrives for follow up. Pt states he is getting nervous on his phone again.     HPI:  26 year old male with autism and OCD presents for follow-up.  Patient is now seeing [facility].  He states that he was given some medication for anxiety.  I believe that he is referring to Atarax.  Continues to take Prozac as prescribed for OCD.  He continues to have obsessive thoughts particular involving 1 video game on his phone.  This is quite troubling and distressing to him.  Patient Active Problem List   Diagnosis Date Noted   Autism 03/31/2022   OCD (obsessive compulsive disorder) 03/31/2022    Social Hx   Social History   Socioeconomic History   Marital status: Single    Spouse name: Not on file   Number of children: Not on file   Years of education: Not on file   Highest education level: Not on file  Occupational History   Not on file  Tobacco Use   Smoking status: Never   Smokeless tobacco: Never  Substance and Sexual Activity   Alcohol use: Not on file   Drug use: Not on file   Sexual activity: Not on file  Other Topics Concern   Not on file  Social History Narrative   Not on file   Social Determinants of Health   Financial Resource Strain: Not on file  Food Insecurity: Not on file  Transportation Needs: Not on file  Physical Activity: Not on file  Stress: Not on file  Social Connections: Not on file    Review of Systems Per HPI  Objective:  BP 132/84   Pulse 78   Wt 206 lb 12.8 oz (93.8 kg)   SpO2 99%   BMI 29.67 kg/m      08/22/2022    9:55 AM 05/22/2022   10:24 AM 03/30/2022   10:16 AM  BP/Weight  Systolic BP 301 601 093  Diastolic BP 84 83 85  Wt. (Lbs) 206.8 209.4 209.6  BMI 29.67 kg/m2 30.05 kg/m2 30.07 kg/m2    Physical Exam Vitals and nursing note reviewed.  Constitutional:       General: He is not in acute distress.    Appearance: Normal appearance. He is obese.  HENT:     Head: Normocephalic and atraumatic.  Cardiovascular:     Rate and Rhythm: Normal rate and regular rhythm.  Pulmonary:     Effort: Pulmonary effort is normal. No respiratory distress.  Neurological:     Mental Status: He is alert.      Assessment & Plan:   Problem List Items Addressed This Visit       Other   OCD (obsessive compulsive disorder) - Primary    Still having difficulty.  Uncontrolled.  Increasing Prozac.  Advised to follow-up with [facility].  Recommended counseling to help deal with obsessive/intrusive thoughts.      Relevant Medications   FLUoxetine (PROZAC) 40 MG capsule    Meds ordered this encounter  Medications   FLUoxetine (PROZAC) 40 MG capsule    Sig: Take 2 capsules (80 mg total) by mouth daily.    Dispense:  180 capsule    Refill:  1    Follow-up:  Return in about 6 months (around 02/20/2023).  [place]

## 2023-01-22 ENCOUNTER — Other Ambulatory Visit: Payer: Self-pay | Admitting: Family Medicine

## 2023-02-15 ENCOUNTER — Ambulatory Visit (INDEPENDENT_AMBULATORY_CARE_PROVIDER_SITE_OTHER): Payer: Medicaid Other | Admitting: Family Medicine

## 2023-02-15 ENCOUNTER — Encounter: Payer: Self-pay | Admitting: Family Medicine

## 2023-02-15 VITALS — BP 104/67 | HR 70 | Temp 97.5°F | Ht 70.0 in | Wt 199.0 lb

## 2023-02-15 DIAGNOSIS — F422 Mixed obsessional thoughts and acts: Secondary | ICD-10-CM

## 2023-02-15 NOTE — Patient Instructions (Signed)
I am placing a referral so that I can get a specialist to help me manage your symptoms.  Continue your medication.

## 2023-02-15 NOTE — Progress Notes (Signed)
   Subjective:  Patient ID: Gohan Collister, male    DOB: 08-02-1996  Age: 27 y.o. MRN: [id number]  CC: Chief Complaint  Patient presents with   mixed obsessional thoughts and acts    6 month follow up- no concerns voiced    HPI:  27 year old male presents for follow-up.  Continues to have obsessive thoughts and repetitive acts.  This is mostly centered on his videogames.  He has now started washing his hands repeatedly.  He endorses compliance with Prozac.  He has not had significant improvement.  Patient Active Problem List   Diagnosis Date Noted   Autism 03/31/2022   OCD (obsessive compulsive disorder) 03/31/2022    Social Hx   Social History   Socioeconomic History   Marital status: Single    Spouse name: Not on file   Number of children: Not on file   Years of education: Not on file   Highest education level: Not on file  Occupational History   Not on file  Tobacco Use   Smoking status: Never   Smokeless tobacco: Never  Substance and Sexual Activity   Alcohol use: Not on file   Drug use: Not on file   Sexual activity: Not on file  Other Topics Concern   Not on file  Social History Narrative   Not on file   Social Determinants of Health   Financial Resource Strain: Not on file  Food Insecurity: Not on file  Transportation Needs: Not on file  Physical Activity: Not on file  Stress: Not on file  Social Connections: Not on file    Review of Systems Per HPI  Objective:  BP 104/67   Pulse 70   Temp (!) 97.5 F (36.4 C)   Ht 5\' 10"  (1.778 m)   Wt 199 lb (90.3 kg)   SpO2 97%   BMI 28.55 kg/m      02/15/2023    8:32 AM 08/22/2022    9:55 AM 05/22/2022   10:24 AM  BP/Weight  Systolic BP 104 132 124  Diastolic BP 67 84 83  Wt. (Lbs) 199 206.8 209.4  BMI 28.55 kg/m2 29.67 kg/m2 30.05 kg/m2    Physical Exam Vitals and nursing note reviewed.  Constitutional:      Appearance: Normal appearance. He is obese.  HENT:     Head: Normocephalic and  atraumatic.  Cardiovascular:     Rate and Rhythm: Normal rate and regular rhythm.  Pulmonary:     Effort: Pulmonary effort is normal. No respiratory distress.  Neurological:     Mental Status: He is alert.      Assessment & Plan:   Problem List Items Addressed This Visit       Other   OCD (obsessive compulsive disorder) - Primary    Continue Prozac.  Referring to psychiatry.      Relevant Orders   Ambulatory referral to Psychiatry   Follow-up:  Return in about 6 months (around 08/18/2023).  Everlene Other DO [facility]

## 2023-02-15 NOTE — Assessment & Plan Note (Signed)
Continue Prozac.  Referring to psychiatry.

## 2023-04-17 ENCOUNTER — Other Ambulatory Visit: Payer: Self-pay | Admitting: Family Medicine

## 2023-05-07 ENCOUNTER — Ambulatory Visit (HOSPITAL_COMMUNITY): Payer: MEDICAID | Admitting: Psychiatry

## 2023-06-14 ENCOUNTER — Encounter (HOSPITAL_COMMUNITY): Payer: Self-pay | Admitting: Psychiatry

## 2023-06-14 ENCOUNTER — Ambulatory Visit (INDEPENDENT_AMBULATORY_CARE_PROVIDER_SITE_OTHER): Payer: MEDICAID | Admitting: Psychiatry

## 2023-06-14 DIAGNOSIS — F410 Panic disorder [episodic paroxysmal anxiety] without agoraphobia: Secondary | ICD-10-CM

## 2023-06-14 DIAGNOSIS — G4726 Circadian rhythm sleep disorder, shift work type: Secondary | ICD-10-CM

## 2023-06-14 DIAGNOSIS — Z789 Other specified health status: Secondary | ICD-10-CM

## 2023-06-14 DIAGNOSIS — F840 Autistic disorder: Secondary | ICD-10-CM | POA: Diagnosis not present

## 2023-06-14 DIAGNOSIS — F321 Major depressive disorder, single episode, moderate: Secondary | ICD-10-CM | POA: Diagnosis not present

## 2023-06-14 DIAGNOSIS — F422 Mixed obsessional thoughts and acts: Secondary | ICD-10-CM | POA: Diagnosis not present

## 2023-06-14 DIAGNOSIS — F411 Generalized anxiety disorder: Secondary | ICD-10-CM | POA: Diagnosis not present

## 2023-06-14 MED ORDER — SERTRALINE HCL 100 MG PO TABS: ORAL_TABLET | ORAL | 1 refills | Status: DC

## 2023-06-14 MED ORDER — FLUOXETINE HCL 40 MG PO CAPS: 40.0000 mg | ORAL_CAPSULE | Freq: Every day | ORAL | Status: DC

## 2023-06-14 MED ORDER — FLUOXETINE HCL 20 MG PO CAPS: 20.0000 mg | ORAL_CAPSULE | Freq: Every day | ORAL | 0 refills | Status: DC

## 2023-06-14 NOTE — Progress Notes (Signed)
Psychiatric Initial Adult Assessment  Patient Identification: Reginald Ross MRN:  [id number] Date of Evaluation:  06/14/2023 Referral Source: PCP  Assessment:  Reginald Ross is a 27 y.o. male with a history of autism, OCD, generalized anxiety disorder with panic attacks, caffeine overuse, shift phase insomnia with snoring, major depressive disorder, possible learning disability who presents to [facility] via video conferencing for initial evaluation of OCD and anxiety.  Patient reported struggling in middle school and being in special classes to help him keep up with his schoolwork when in high school.  Carried a diagnosis of autism spectrum which appeared consistent on initial exam.  With that in mind OCD is highly comorbid in this population and over the last year to year and a half starting in 2022 the patient had worsening of sessions with more and more intricate compulsions that he had to follow as outlined in HPI of initial assessment on 06/14/2023.  Fluoxetine appeared to be helpful for anxiety in combination with hydroxyzine but per patient and patient's mother had not led to significant change in OCD symptoms.  Carefully outlined plan change from fluoxetine to sertraline with grandmother present as patient had significant difficulty in following instructions as outlined.  He had previously been trialed on citalopram as well so do not imagine that monotherapy with sertraline will be fully effective and will plan on adding either Abilify or risperidone which has decent data in OCD and autism populations.  He had significant caffeine overuse but as above struggles to understand how to cut back so this may be a long-term discussion.  Encouraged coordinating with [employer] [facility] [place] for further behavioral resources as he was not involved in psychotherapy which will also be necessary to address his OCD.  Would classify the major depression as single episode in the  context of developing the OCD and can get quite extreme when low frustration tolerance causes him to have thoughts of dying but never has intent or plan with this.  Will coordinate with PCP for baseline blood work.  Follow-up in 1 month.  For safety, his acute risk factors for suicide are: Diagnosis of OCD and depression, and intermittent thoughts of suicide.  His chronic risk factors for suicide are: Unemployment, chronic mental illness, firearms in the home.  His protective factors are: Supportive family, no intent or plan with suicidal ideation, actively seeking and engaging with mental health care, does not know how to access to firearms in the home, hope for the future.  While future events cannot be fully predicted he does not currently meet IBC criteria and can be continued as an outpatient.  Plan:  # Autism spectrum disorder with OCD Past medication trials: Fluoxetine, citalopram, hydroxyzine Status of problem: New to provider Interventions: -- Cross-taper fluoxetine to 40 mg once daily for 1 week.  Then 20 mg once daily for 1 week then discontinue.  -- Once off fluoxetine start sertraline 50 mg once daily for 1 week then increase to 100 mg once daily with plan to titrate further -- Patient to contact the [employer] [place] for further behavioral resources and psychotherapy --We will likely plan on either risperidone or Abilify in the future to augment  # Generalized anxiety disorder with panic attacks  caffeine overuse Past medication trials:  Status of problem:  New to provider Interventions: -- Fluoxetine, sertraline, psychotherapy as above --Continue hydroxyzine 25 mg twice daily as needed for panic --Patient to cut back on caffeine use  # Major  depressive disorder, single episode, moderate with intermittent passive suicidal ideation Past medication trials:  Status of problem:  New to provider Interventions: -- Fluoxetine, sertraline, psychotherapy as above  #  Shift phase sleep disorder Past medication trials:  Status of problem:  New to provider Interventions: -- Patient to cut back on caffeine --CBT-I  Patient was given contact information for behavioral health clinic and was instructed to call 911 for emergencies.   Subjective:  Chief Complaint:  Chief Complaint  Patient presents with   Anxiety   OCD   Establish Care    History of Present Illness:  Has been having OCD trouble. Anxiety problems have been going on for awhile and do seem to be more controllable. Finds he can't play his games until he does the OCD, describes having to follow noise patterns and will have to start over if he messes it up.   Lives with his grandmother in [place]. Worked at [employer] but then COVID happened. Likes video games, [employer], can't watch videos anymore because of the OCD. Has been awhile since has been able to enjoy, up to a year or 1.5 years since he has been able to watch a video. Trouble falling asleep, will go to bed at 2a and sleep for 6hrs but if no one wakes him up can sleep 12hrs at a time. Snores but no sleep study. Restless legs. Drinks 3-4 sodas per day, bottles. Will drink tea when at his mother's and will have a coffee at [facility]. Used to drink a monster daily. Snacks throughout the day on junk food and will have a meal if going out to eat. No binges. Is a picky eater so if food he wants is not present will not eat it. No purging. Concentration is adequate. Imagines cartoon characters or WWE stars doing different things. Fidgety. By the time high school came did better, had special classes to help catch up. Can struggle with guilt feelings when anxiety starts; has been learning to control. SI has come up in the setting of OCD stress but no plans or intent.   Chronic worry across multiple domains with impact on sleep and muscle tension. Panic attacks happen daily. Compulsions can be rearranging, taking off clothes in certain order,  fidgeting with pillow, turning on electronics, washing hands repeatedly, re-reading things. Obsessions will be hurting himself or killing himself if he doesn't comply with the compulsions. Still denies plan or intent when these occur. Denies sleeplessness greater than 1 day. No hallucinations. Sometimes paranoid that people look at him.   No alcohol or cigarettes but has tried. No other drugs.   Past Psychiatric History:  Diagnoses: OCD, autism Medication trials: fluoxetine (ineffective at 80mg  daily), hydroxyzine (effective), citalopram (ineffective) Previous psychiatrist/therapist: yes to both Hospitalizations: none Suicide attempts: none SIB: sometimes will slap head in frustration Hx of violence towards others: none Current access to guns: yes secured in gun safe but doesn't know how to access Hx of trauma/abuse: none  Previous Psychotropic Medications: Yes   Substance Abuse History in the last 12 months:  No.  Past Medical History:  Past Medical History:  Diagnosis Date   Asperger syndrome     Past Surgical History:  Procedure Laterality Date   ABDOMINAL SURGERY     muscle surgery as an infant    Family Psychiatric History: none known  Family History:  Family History  Family history unknown: Yes    Social History:   Academic/Vocational: none currently, used to work at [employer]  General  Social History   Socioeconomic History   Marital status: Single    Spouse name: Not on file   Number of children: Not on file   Years of education: Not on file   Highest education level: Not on file  Occupational History   Not on file  Tobacco Use   Smoking status: Never   Smokeless tobacco: Never   Tobacco comments:    Tried smoking once but did not like it  Substance and Sexual Activity   Alcohol use: Not Currently    Comment: Tried once and did not like it   Drug use: Never   Sexual activity: Not on file  Other Topics Concern   Not on file  Social History Narrative    Not on file   Social Determinants of Health   Financial Resource Strain: Not on file  Food Insecurity: Not on file  Transportation Needs: Not on file  Physical Activity: Not on file  Stress: Not on file  Social Connections: Not on file    Additional Social History: updated  Allergies:  No Known Allergies  Current Medications: Current Outpatient Medications  Medication Sig Dispense Refill   FLUoxetine (PROZAC) 20 MG capsule Take 1 capsule (20 mg total) by mouth daily. After finishing 40mg  daily for 1 week then discontinue. 7 capsule 0   sertraline (ZOLOFT) 100 MG tablet Take half a tablet daily for one week after finishing fluoxetine 20mg  daily. Then increase to 1 full tablet daily thereafter. 30 tablet 1   FLUoxetine (PROZAC) 40 MG capsule Take 1 capsule (40 mg total) by mouth daily for 7 days. Then discontinue.     hydrOXYzine (ATARAX) 25 MG tablet Take 25 mg by mouth 2 (two) times daily as needed.     No current facility-administered medications for this visit.    ROS: Review of Systems  Constitutional:  Positive for appetite change. Negative for unexpected weight change.  Gastrointestinal:  Negative for constipation, diarrhea, nausea and vomiting.  Endocrine: Positive for heat intolerance. Negative for cold intolerance and polyphagia.  Musculoskeletal:  Negative for arthralgias, back pain and myalgias.  Skin:        No hair loss  Neurological:  Positive for headaches.       Dizziness from pacing in circles  Psychiatric/Behavioral:  Positive for dysphoric mood, sleep disturbance and suicidal ideas. Negative for decreased concentration, hallucinations and self-injury. The patient is nervous/anxious.     Objective:  Psychiatric Specialty Exam: There were no vitals taken for this visit.There is no height or weight on file to calculate BMI.  General Appearance: Casual, Fairly Groomed, and wearing a hat.  Appears stated age  Eye Contact:  Fair  Speech:  Clear and  Coherent and pressured but interruptible.  Slight impairment to articulation  Volume:  Normal  Mood:   "I have been dealing with anxiety and OCD"  Affect:  Appropriate, Congruent, and overall elevated but primarily anxiety  Thought Content: Logical, Hallucinations: None, and Obsessions as outlined in HPI  Suicidal Thoughts:   Intermittent as outlined in HPI none in session today.  Homicidal Thoughts:  No  Thought Process:  Descriptions of Associations: Tangential  Orientation:  Full (Time, Place, and Person)    Memory: Grossly intact   Judgment:  Other:  Limited  Insight:   Limited  Concentration:  Concentration: Poor and Attention Span: Poor  Recall:  not formally assessed   Fund of Knowledge: Poor  Language: Fair  Psychomotor Activity:  Increased and Restlessness  Akathisia:  No  AIMS (if indicated): not done  Assets:  Communication Skills Desire for Improvement Financial Resources/Insurance Housing Leisure Time Physical Health Resilience Social Support Talents/Skills Transportation  ADL's:  Impaired  Cognition: Cannot rule out impairment  Sleep:  Poor   PE: General: sits comfortably in view of camera; no acute distress though does get frustrated easily Pulm: no increased work of breathing on room air  MSK: all extremity movements appear intact  Neuro: no focal neurological deficits observed  Gait & Station: unable to assess by video    Metabolic Disorder Labs: No results found for: "HGBA1C", "MPG" No results found for: "PROLACTIN" No results found for: "CHOL", "TRIG", "HDL", "CHOLHDL", "VLDL", "LDLCALC" No results found for: "TSH"  Therapeutic Level Labs: No results found for: "LITHIUM" No results found for: "CBMZ" No results found for: "VALPROATE"  Screenings:  PHQ2-9    Flowsheet Row Office Visit from 06/14/2023 in [facility] at [facility] Total Score 5  PHQ-9 Total Score 12      Flowsheet Row Office Visit from  06/14/2023 in [facility] at [place] [facility] from 11/09/2020 in [facility] at [facility]  C-SSRS RISK CATEGORY Low Risk No Risk       Collaboration of Care: Collaboration of Care: Medication Management AEB as above, Primary Care Provider AEB as above, and Referral or follow-up with counselor/therapist AEB as above  Patient/Guardian was advised Release of Information must be obtained prior to any record release in order to collaborate their care with an outside provider. Patient/Guardian was advised if they have not already done so to contact the registration department to sign all necessary forms in order for [place] to release information regarding their care.   Consent: Patient/Guardian gives verbal consent for treatment and assignment of benefits for services provided during this visit. Patient/Guardian expressed understanding and agreed to proceed.   Televisit via video: I connected with [name] on 06/14/23 at  9:00 AM EDT by a video enabled telemedicine application and verified that I am speaking with the correct person using two identifiers.  Location: Patient: [facility] Provider: home office   I discussed the limitations of evaluation and management by telemedicine and the availability of in person appointments. The patient expressed understanding and agreed to proceed.  I discussed the assessment and treatment plan with the patient. The patient was provided an opportunity to ask questions and all were answered. The patient agreed with the plan and demonstrated an understanding of the instructions.   The patient was advised to call back or seek an in-person evaluation if the symptoms worsen or if the condition fails to improve as anticipated.  I provided 60 minutes dedicated to the care of this patient via video on the date of this encounter to include chart review, face-to-face time with the patient,  medication management/counseling, coordination of care with primary care provider, gaining collateral from patient's grandmother.  Elsie Lincoln, MD 8/29/202412:12 PM

## 2023-06-14 NOTE — Patient Instructions (Addendum)
We decreased the fluoxetine to 40 mg once daily today.  Take this for 1 week then decrease to 20 mg once daily for 1 week.  Then discontinue.  Once you have discontinued the fluoxetine you can start the sertraline at 50 mg (half a tablet) once daily for 1 week.  Then increase to a full tablet daily thereafter.  When he get a chance please contact the [employer] [place] for further behavioral resources and they may be able to connect you with a therapist as well: [email or web address]  I would also recommend cutting back caffeine by half a unit (the unit would be either a bottle, cup, can) every 5 days until you are drinking 1 unit of caffeine daily no later than 12 PM.  I will also coordinate with your primary care provider to get some blood work.

## 2023-07-07 ENCOUNTER — Other Ambulatory Visit (HOSPITAL_COMMUNITY): Payer: Self-pay | Admitting: Psychiatry

## 2023-07-07 DIAGNOSIS — F422 Mixed obsessional thoughts and acts: Secondary | ICD-10-CM

## 2023-07-07 DIAGNOSIS — F410 Panic disorder [episodic paroxysmal anxiety] without agoraphobia: Secondary | ICD-10-CM

## 2023-07-07 DIAGNOSIS — F321 Major depressive disorder, single episode, moderate: Secondary | ICD-10-CM

## 2023-07-16 ENCOUNTER — Telehealth (INDEPENDENT_AMBULATORY_CARE_PROVIDER_SITE_OTHER): Payer: MEDICAID | Admitting: Psychiatry

## 2023-07-16 ENCOUNTER — Encounter (HOSPITAL_COMMUNITY): Payer: Self-pay | Admitting: Psychiatry

## 2023-07-16 DIAGNOSIS — F840 Autistic disorder: Secondary | ICD-10-CM | POA: Diagnosis not present

## 2023-07-16 DIAGNOSIS — F321 Major depressive disorder, single episode, moderate: Secondary | ICD-10-CM

## 2023-07-16 DIAGNOSIS — F422 Mixed obsessional thoughts and acts: Secondary | ICD-10-CM | POA: Diagnosis not present

## 2023-07-16 DIAGNOSIS — F411 Generalized anxiety disorder: Secondary | ICD-10-CM | POA: Diagnosis not present

## 2023-07-16 DIAGNOSIS — G4726 Circadian rhythm sleep disorder, shift work type: Secondary | ICD-10-CM

## 2023-07-16 DIAGNOSIS — F410 Panic disorder [episodic paroxysmal anxiety] without agoraphobia: Secondary | ICD-10-CM

## 2023-07-16 DIAGNOSIS — Z789 Other specified health status: Secondary | ICD-10-CM

## 2023-07-16 MED ORDER — SERTRALINE HCL 100 MG PO TABS: 200.0000 mg | ORAL_TABLET | Freq: Every day | ORAL | 2 refills | Status: DC

## 2023-07-16 NOTE — Patient Instructions (Signed)
We increased his Zoloft (sertraline) to 200 mg once daily.  Your best to try and cut back on caffeine and that should make it easier to sleep at night and give you less anxiety.

## 2023-07-16 NOTE — Progress Notes (Signed)
BH MD Outpatient Progress Note  07/16/2023 11:47 AM [name]  MRN:  [id number]  Assessment:  Reginald Ross presents for follow-up evaluation. Today, 07/16/23, patient reports worsening of OCD obsessive thoughts and compulsive behaviors in the setting of switching from fluoxetine to sertraline.  This was to be expected and thankfully patient tolerated the switch well with plan to titrate as outlined in plan below.  Aggressive dosing schedule planned as OCD frequently requires higher doses of SSRI.  We will similarly have plan for addition of either Abilify or Risperdal once at stable dose of sertraline.  Patient's grandmother still helping monitor medication and will try to connect him with autism Society of [facility] resources.  Interestingly, he did not report suicidal ideation since the switch so hopefully some early benefit of Zoloft being noted. He is not involved in psychotherapy which will also be necessary to address his OCD.  Still struggling with concept of cutting back on caffeine.  Follow-up in 1 month.   For safety, his acute risk factors for suicide are: Diagnosis of OCD and depression, and intermittent thoughts of suicide.  His chronic risk factors for suicide are: Unemployment, chronic mental illness, firearms in the home.  His protective factors are: Supportive family, no intent or plan with suicidal ideation, actively seeking and engaging with mental health care, does not know how to access to firearms in the home, hope for the future.  While future events cannot be fully predicted he does not currently meet IVC criteria and can be continued as an outpatient.  Identifying Information: Reginald Ross is a 27 y.o. male with a history of autism, OCD, generalized anxiety disorder with panic attacks, caffeine overuse, shift phase insomnia with snoring, major depressive disorder, possible learning disability who is an established patient with [facility].  Initial evaluation of OCD and anxiety on 06/14/23; please see that note for full case formulation.  Patient reported struggling in middle school and being in special classes to help him keep up with his schoolwork when in high school.  Carried a diagnosis of autism spectrum which appeared consistent on initial exam.  With that in mind OCD is highly comorbid in this population and over the last year to year and a half starting in 2022 the patient had worsening of sessions with more and more intricate compulsions that he had to follow as outlined in HPI of initial assessment on 06/14/2023.  Fluoxetine appeared to be helpful for anxiety in combination with hydroxyzine but per patient and patient's mother had not led to significant change in OCD symptoms.  Carefully outlined plan change from fluoxetine to sertraline with grandmother present as patient had significant difficulty in following instructions as outlined.  He had previously been trialed on citalopram as well so do not imagine that monotherapy with sertraline will be fully effective and will plan on adding either Abilify or risperidone which has decent data in OCD and autism populations.  He had significant caffeine overuse but as above struggles to understand how to cut back so this may be a long-term discussion.  Would classify the major depression as single episode in the context of developing the OCD and can get quite extreme when low frustration tolerance causes him to have thoughts of dying but never has intent or plan with this.  Will coordinate with PCP for baseline blood work.     Plan:   # Autism spectrum disorder with OCD Past medication trials: Fluoxetine, citalopram, hydroxyzine Status of problem: Chronic with  moderate exacerbation Interventions: -- Titrate sertraline to 200 mg once daily (i9/30/24) -- Patient to contact the autism Society of [place] for further behavioral resources and psychotherapy --We will likely plan on either  risperidone or Abilify in the future to augment   # Generalized anxiety disorder with panic attacks  caffeine overuse Past medication trials:  Status of problem: Chronic with moderate exacerbation Interventions: -- sertraline, psychotherapy as above --Continue hydroxyzine 25 mg twice daily as needed for panic --Patient to cut back on caffeine use   # Major depressive disorder, single episode, moderate with intermittent passive suicidal ideation Past medication trials:  Status of problem: Improving Interventions: -- sertraline, psychotherapy as above   # Shift phase sleep disorder Past medication trials:  Status of problem: Chronic and stable Interventions: -- Patient to cut back on caffeine --CBT-I  Patient was given contact information for behavioral health clinic and was instructed to call 911 for emergencies.   Subjective:  Chief Complaint:  Chief Complaint  Patient presents with   OCD   Autism   Anxiety   Depression   Follow-up    Interval History: At the point where he is wanting to play his playstation again after beating one game. Did note worsening of OCD symptoms with the switch with making repeated noise patterns and pacing, says he is going from a 4 to an 11. No side effects of GI symptoms or headaches and was amenable to further titration today. Denies having SI when directly asked (several responses were nonsensical to questions asked). Still having excessive caffeine intake with 3 Dr. Pat Kocher daily. Didn't contact autism society.  Brings grandmother in for last part of visit. Confirms he was able to make the switch to zoloft. They will try to connect with autism society of [place]; he does have a transportation limitation. Notes he also stays up all night playing video games and reviewed cutting back on caffeine.   Visit Diagnosis:    ICD-10-CM   1. Autism  F84.0     2. Mixed obsessional thoughts and acts  F42.2 sertraline (ZOLOFT) 100 MG tablet    3.  Generalized anxiety disorder with panic attacks  F41.1 sertraline (ZOLOFT) 100 MG tablet   F41.0     4. Major depressive disorder, single episode, moderate (HCC)  F32.1 sertraline (ZOLOFT) 100 MG tablet    5. Caffeine overuse  Z78.9     6. 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  G47.26       Past Psychiatric History:  Diagnoses: autism, OCD, generalized anxiety disorder with panic attacks, caffeine overuse, shift phase insomnia with snoring, major depressive disorder, possible learning disability Medication trials: fluoxetine (ineffective at 80mg  daily), hydroxyzine (effective), citalopram (ineffective) Previous psychiatrist/therapist: yes to both Hospitalizations: none Suicide attempts: none SIB: sometimes will slap head in frustration Hx of violence towards others: none Current access to guns: yes secured in gun safe but doesn't know how to access Hx of trauma/abuse: none Substance use: none  Past Medical History:  Past Medical History:  Diagnosis Date   Asperger syndrome     Past Surgical History:  Procedure Laterality Date   ABDOMINAL SURGERY     muscle surgery as an infant    Family Psychiatric History: none known   Family History:  Family History  Family history unknown: Yes    Social History:  Academic/Vocational: none currently, used to work at [employer]   Social History   Socioeconomic History   Marital status: Single  Spouse name: Not on file   Number of children: Not on file   Years of education: Not on file   Highest education level: Not on file  Occupational History   Not on file  Tobacco Use   Smoking status: Never   Smokeless tobacco: Never   Tobacco comments:    Tried smoking once but did not like it  Substance and Sexual Activity   Alcohol use: Not Currently    Comment: Tried once and did not like it   Drug use: Never   Sexual activity: Not on file  Other Topics Concern   Not on file  Social History Narrative   Not  on file   Social Determinants of Health   Financial Resource Strain: Not on file  Food Insecurity: Not on file  Transportation Needs: Not on file  Physical Activity: Not on file  Stress: Not on file  Social Connections: Not on file    Allergies: No Known Allergies  Current Medications: Current Outpatient Medications  Medication Sig Dispense Refill   hydrOXYzine (ATARAX) 25 MG tablet Take 25 mg by mouth 2 (two) times daily as needed.     sertraline (ZOLOFT) 100 MG tablet Take 2 tablets (200 mg total) by mouth daily. 60 tablet 2   No current facility-administered medications for this visit.    ROS: Review of Systems  Constitutional:  Positive for appetite change. Negative for unexpected weight change.  Gastrointestinal:  Negative for constipation, diarrhea, nausea and vomiting.  Endocrine: Positive for heat intolerance. Negative for cold intolerance and polyphagia.  Musculoskeletal:  Negative for arthralgias, back pain and myalgias.  Neurological:  Positive for headaches.  Psychiatric/Behavioral:  Positive for dysphoric mood and sleep disturbance. Negative for decreased concentration, hallucinations, self-injury and suicidal ideas. The patient is nervous/anxious.        Obsessive thoughts and compulsive behaviors    Objective:  Psychiatric Specialty Exam: There were no vitals taken for this visit.There is no height or weight on file to calculate BMI.  General Appearance: Casual, Fairly Groomed, and wearing a hat.  Appears stated age  Eye Contact:  Minimal  Speech:   Slight impairment to articulation.  Slight stutter.  Fast rate but interruptible  Volume:  Increased  Mood:   "It did get worse"  Affect:  Appropriate, Congruent, and anxious.  Spontaneous smile  Thought Content: Logical, Hallucinations: None, Obsessions, and Rumination   Suicidal Thoughts:   Denies any in session today  Homicidal Thoughts:  No  Thought Process:  Descriptions of Associations: Tangential,  concrete  Orientation:  Full (Time, Place, and Person)    Memory:  Grossly intact   Judgment:  Other:  Chronically limited  Insight:   Chronically limited  Concentration:  Concentration: Poor  Recall:  not formally assessed   Fund of Knowledge: Poor  Language: Poor  Psychomotor Activity:  Increased  Akathisia:  No  AIMS (if indicated): not done  Assets:  Communication Skills Desire for Improvement Financial Resources/Insurance Housing Leisure Time Physical Health Social Support Talents/Skills  ADL's:  Impaired  Cognition: WNL  Sleep:  Poor   PE: General: sits comfortably in view of camera; no acute distress  Pulm: no increased work of breathing on room air  MSK: all extremity movements appear intact  Neuro: no focal neurological deficits observed  Gait & Station: unable to assess by video    Metabolic Disorder Labs: No results found for: "HGBA1C", "MPG" No results found for: "PROLACTIN" No results found for: "CHOL", "TRIG", "HDL", "  CHOLHDL", "VLDL", "LDLCALC" No results found for: "TSH"  Therapeutic Level Labs: No results found for: "LITHIUM" No results found for: "VALPROATE" No results found for: "CBMZ"  Screenings:  PHQ2-9    Flowsheet Row Office Visit from 06/14/2023 in [facility] at [facility] Total Score 5  PHQ-9 Total Score 12      Flowsheet Row Office Visit from 06/14/2023 in [facility] from 11/09/2020 in [facility] at [facility]  C-SSRS RISK CATEGORY Low Risk No Risk       Collaboration of Care: Collaboration of Care: Medication Management AEB as above, Primary Care Provider AEB as above, and Community Stakeholder(s) AEB as above  Patient/Guardian was advised Release of Information must be obtained prior to any record release in order to collaborate their care with an outside provider. Patient/Guardian was advised if they have not already  done so to contact the registration department to sign all necessary forms in order for [place] to release information regarding their care.   Consent: Patient/Guardian gives verbal consent for treatment and assignment of benefits for services provided during this visit. Patient/Guardian expressed understanding and agreed to proceed.   Televisit via video: I connected with patient on 07/16/23 at 11:30 AM EDT by a video enabled telemedicine application and verified that I am speaking with the correct person using two identifiers.  Location: Patient:  behavioral health Provider: remote office in [place]   I discussed the limitations of evaluation and management by telemedicine and the availability of in person appointments. The patient expressed understanding and agreed to proceed.  I discussed the assessment and treatment plan with the patient. The patient was provided an opportunity to ask questions and all were answered. The patient agreed with the plan and demonstrated an understanding of the instructions.   The patient was advised to call back or seek an in-person evaluation if the symptoms worsen or if the condition fails to improve as anticipated.  I provided 30 minutes dedicated to the care of this patient via video on the date of this encounter to include chart review, face-to-face time with the patient, medication management/counseling, documentation, discussion of care with patient's grandmother.  Elsie Lincoln, MD 07/16/2023, 11:47 AM

## 2023-08-08 ENCOUNTER — Other Ambulatory Visit (HOSPITAL_COMMUNITY): Payer: Self-pay | Admitting: Psychiatry

## 2023-08-08 DIAGNOSIS — F410 Panic disorder [episodic paroxysmal anxiety] without agoraphobia: Secondary | ICD-10-CM

## 2023-08-08 DIAGNOSIS — F321 Major depressive disorder, single episode, moderate: Secondary | ICD-10-CM

## 2023-08-08 DIAGNOSIS — F422 Mixed obsessional thoughts and acts: Secondary | ICD-10-CM

## 2023-08-17 ENCOUNTER — Telehealth (HOSPITAL_COMMUNITY): Payer: MEDICAID | Admitting: Psychiatry

## 2023-08-17 ENCOUNTER — Encounter (HOSPITAL_COMMUNITY): Payer: Self-pay | Admitting: Psychiatry

## 2023-08-17 DIAGNOSIS — F840 Autistic disorder: Secondary | ICD-10-CM

## 2023-08-17 DIAGNOSIS — Z789 Other specified health status: Secondary | ICD-10-CM

## 2023-08-17 DIAGNOSIS — F411 Generalized anxiety disorder: Secondary | ICD-10-CM | POA: Diagnosis not present

## 2023-08-17 DIAGNOSIS — F410 Panic disorder [episodic paroxysmal anxiety] without agoraphobia: Secondary | ICD-10-CM

## 2023-08-17 DIAGNOSIS — F321 Major depressive disorder, single episode, moderate: Secondary | ICD-10-CM | POA: Diagnosis not present

## 2023-08-17 DIAGNOSIS — G4726 Circadian rhythm sleep disorder, shift work type: Secondary | ICD-10-CM

## 2023-08-17 DIAGNOSIS — F422 Mixed obsessional thoughts and acts: Secondary | ICD-10-CM

## 2023-08-17 NOTE — Progress Notes (Signed)
BH MD Outpatient Progress Note  08/17/2023 11:34 AM Reginald Ross  MRN:  [id number]  Assessment:  Reginald Ross presents for follow-up evaluation. Today, 08/17/23, patient reports improving of OCD obsessive thoughts and compulsive behaviors in the setting of titration of max dose sertraline.  He is still having repetitive behaviors which is endorsed by his grandmother as well but does agree that he seems to be getting a little bit better.  They were both amenable to trial of Abilify once he can get baseline antipsychotic monitoring labs at PCP appointment this Tuesday.  Patient's grandmother still helping monitor medication and will try to connect him with autism Society of [facility] resources.  Interestingly, he did not report suicidal ideation since the switch so hopefully benefit of Zoloft being noted. He is not involved in psychotherapy which will also be necessary to address his OCD.  Still struggling with concept of cutting back on caffeine.  Follow-up in 1 month.   For safety, his acute risk factors for suicide are: Diagnosis of OCD and depression, and intermittent thoughts of suicide.  His chronic risk factors for suicide are: Unemployment, chronic mental illness, firearms in the home.  His protective factors are: Supportive family, no intent or plan with suicidal ideation, actively seeking and engaging with mental health care, does not know how to access to firearms in the home, hope for the future.  While future events cannot be fully predicted he does not currently meet IVC criteria and can be continued as an outpatient.  Identifying Information: Reginald Ross is a 27 y.o. male with a history of autism, OCD, generalized anxiety disorder with panic attacks, caffeine overuse, shift phase insomnia with snoring, major depressive disorder, possible learning disability who is an established patient with [facility]. Initial evaluation of OCD and anxiety on 06/14/23;  please see that note for full case formulation.  Patient reported struggling in middle school and being in special classes to help him keep up with his schoolwork when in high school.  Carried a diagnosis of autism spectrum which appeared consistent on initial exam.  With that in mind OCD is highly comorbid in this population and over the last year to year and a half starting in 2022 the patient had worsening of sessions with more and more intricate compulsions that he had to follow as outlined in HPI of initial assessment on 06/14/2023.  Fluoxetine appeared to be helpful for anxiety in combination with hydroxyzine but per patient and patient's mother had not led to significant change in OCD symptoms.  Carefully outlined plan change from fluoxetine to sertraline with grandmother present as patient had significant difficulty in following instructions as outlined.  He had previously been trialed on citalopram as well so do not imagine that monotherapy with sertraline will be fully effective and will plan on adding either Abilify or risperidone which has decent data in OCD and autism populations.  He had significant caffeine overuse but as above struggles to understand how to cut back so this may be a long-term discussion.  Would classify the major depression as single episode in the context of developing the OCD and can get quite extreme when low frustration tolerance causes him to have thoughts of dying but never has intent or plan with this.  Will coordinate with PCP for baseline blood work.     Plan:   # Autism spectrum disorder with OCD Past medication trials: Fluoxetine, citalopram, hydroxyzine Status of problem: Improving Interventions: -- Continue sertraline 200 mg once  daily (i9/30/24) and consider further titration to 250mg  for OCD dosing -- Patient to contact the autism Society of [place] for further behavioral resources and psychotherapy --We will likely plan on Abilify in the future to  augment once a baseline antipsychotic monitoring labs are obtained   # Generalized anxiety disorder with panic attacks  caffeine overuse Past medication trials:  Status of problem: Improving Interventions: -- sertraline, psychotherapy as above --Continue hydroxyzine 25 mg twice daily as needed for panic --Patient to cut back on caffeine use   # Major depressive disorder, single episode, moderate with intermittent passive suicidal ideation Past medication trials:  Status of problem: Improving Interventions: -- sertraline, psychotherapy as above   # Shift phase sleep disorder Past medication trials:  Status of problem: improving Interventions: -- Patient to cut back on caffeine --CBT-I  # Anticipated start of antipsychotic Past medication trials:  Status of problem: New to provider Interventions: -- coordinate with PCP for ecg, lipid panel, a1c  Patient was given contact information for behavioral health clinic and was instructed to call 911 for emergencies.   Subjective:  Chief Complaint:  No chief complaint on file.   Interval History: Things have been fine, thinks he is getting a little better. Had to get rid of [employer] again but forgot to make his deck the right way so rather than worry just quit. Had a little bit of headaches with the sertraline increase but went away after the first few days. Still paces but not as much as he used to. The noise patterns are still the same though every time he does a new activity. Switched from Dr. Reino Kent to sprite, and 1 coke per day. Will probably continue with caffeine when going out to eat. Says sleep is fine; getting 7-9hrs per night. Does think overall a tiny bit of progress. Still denies having SI when directly asked (several responses were nonsensical to questions asked).  Brings grandmother in for last part of visit. Seems to have calmed down a bit with the zoloft but still with odd hours of sleeping and up at night. Still up  and down with the trashcan sounds repetitively. Notes he does still get defensive when asking him questions. Still didn't connect with autism society of [place]; he does have a transportation limitation. She did get a notification that he could go to a camp next year. Confirms he will see Dr. Adriana Simas next week and can get labs then.    Visit Diagnosis:  No diagnosis found.   Past Psychiatric History:  Diagnoses: autism, OCD, generalized anxiety disorder with panic attacks, caffeine overuse, shift phase insomnia with snoring, major depressive disorder, possible learning disability Medication trials: fluoxetine (ineffective at 80mg  daily), hydroxyzine (effective), citalopram (ineffective), Zoloft (partially effective) Previous psychiatrist/therapist: yes to both Hospitalizations: none Suicide attempts: none SIB: sometimes will slap head in frustration Hx of violence towards others: none Current access to guns: yes secured in gun safe but doesn't know how to access Hx of trauma/abuse: none Substance use: none  Past Medical History:  Past Medical History:  Diagnosis Date   Asperger syndrome     Past Surgical History:  Procedure Laterality Date   ABDOMINAL SURGERY     muscle surgery as an infant    Family Psychiatric History: none known   Family History:  Family History  Family history unknown: Yes    Social History:  Academic/Vocational: none currently, used to work at [employer]   Social History   Socioeconomic History  Marital status: Single    Spouse name: Not on file   Number of children: Not on file   Years of education: Not on file   Highest education level: Not on file  Occupational History   Not on file  Tobacco Use   Smoking status: Never   Smokeless tobacco: Never   Tobacco comments:    Tried smoking once but did not like it  Substance and Sexual Activity   Alcohol use: Not Currently    Comment: Tried once and did not like it   Drug use: Never   Sexual  activity: Not on file  Other Topics Concern   Not on file  Social History Narrative   Not on file   Social Determinants of Health   Financial Resource Strain: Not on file  Food Insecurity: Not on file  Transportation Needs: Not on file  Physical Activity: Not on file  Stress: Not on file  Social Connections: Not on file    Allergies: No Known Allergies  Current Medications: Current Outpatient Medications  Medication Sig Dispense Refill   hydrOXYzine (ATARAX) 25 MG tablet Take 25 mg by mouth 2 (two) times daily as needed.     sertraline (ZOLOFT) 100 MG tablet Take 2 tablets (200 mg total) by mouth daily. 60 tablet 2   No current facility-administered medications for this visit.    ROS: Review of Systems  Constitutional:  Positive for appetite change. Negative for unexpected weight change.  Gastrointestinal:  Negative for constipation, diarrhea, nausea and vomiting.  Endocrine: Positive for heat intolerance. Negative for cold intolerance and polyphagia.  Musculoskeletal:  Negative for arthralgias, back pain and myalgias.  Neurological:  Positive for headaches.  Psychiatric/Behavioral:  Positive for sleep disturbance. Negative for decreased concentration, dysphoric mood, hallucinations, self-injury and suicidal ideas. The patient is nervous/anxious.        Obsessive thoughts and compulsive behaviors    Objective:  Psychiatric Specialty Exam: There were no vitals taken for this visit.There is no height or weight on file to calculate BMI.  General Appearance: Casual, Fairly Groomed, and wearing a hat.  Appears stated age  Eye Contact:  Minimal  Speech:   Slight impairment to articulation.  Slight stutter.  Fast rate but interruptible  Volume:  Increased  Mood:   "I think it is helping a little bit"  Affect:  Appropriate, Congruent, and anxious.  Spontaneous smile  Thought Content: Logical, Hallucinations: None, Obsessions, and Rumination   Suicidal Thoughts:   Denies any  in session today  Homicidal Thoughts:  No  Thought Process:  Descriptions of Associations: Tangential, concrete  Orientation:  Full (Time, Place, and Person)    Memory:  Grossly intact   Judgment:  Other:  Chronically limited  Insight:   Chronically limited  Concentration:  Concentration: Poor  Recall:  not formally assessed   Fund of Knowledge: Poor  Language: Poor  Psychomotor Activity:  Increased  Akathisia:  No  AIMS (if indicated): not done  Assets:  Communication Skills Desire for Improvement Financial Resources/Insurance Housing Leisure Time Physical Health Social Support Talents/Skills  ADL's:  Impaired  Cognition: WNL  Sleep:  Poor   PE: General: sits comfortably in view of camera; no acute distress  Pulm: no increased work of breathing on room air  MSK: all extremity movements appear intact  Neuro: no focal neurological deficits observed  Gait & Station: unable to assess by video    Metabolic Disorder Labs: No results found for: "HGBA1C", "MPG" No results found  for: "PROLACTIN" No results found for: "CHOL", "TRIG", "HDL", "CHOLHDL", "VLDL", "LDLCALC" No results found for: "TSH"  Therapeutic Level Labs: No results found for: "LITHIUM" No results found for: "VALPROATE" No results found for: "CBMZ"  Screenings:  PHQ2-9    Flowsheet Row Office Visit from 06/14/2023 in [facility] at [facility] Total Score 5  PHQ-9 Total Score 12      Flowsheet Row Office Visit from 06/14/2023 in [facility] at [place] [facility] from 11/09/2020 in [facility] at [facility]  C-SSRS RISK CATEGORY Low Risk No Risk       Collaboration of Care: Collaboration of Care: Medication Management AEB as above, Primary Care Provider AEB as above, and Community Stakeholder(s) AEB as above  Patient/Guardian was advised Release of Information must be obtained prior to any record release in order  to collaborate their care with an outside provider. Patient/Guardian was advised if they have not already done so to contact the registration department to sign all necessary forms in order for [place] to release information regarding their care.   Consent: Patient/Guardian gives verbal consent for treatment and assignment of benefits for services provided during this visit. Patient/Guardian expressed understanding and agreed to proceed.   Televisit via video: I connected with patient on 08/17/23 at 11:30 AM EDT by a video enabled telemedicine application and verified that I am speaking with the correct person using two identifiers.  Location: Patient: [place] Provider: remote office in [place]   I discussed the limitations of evaluation and management by telemedicine and the availability of in person appointments. The patient expressed understanding and agreed to proceed.  I discussed the assessment and treatment plan with the patient. The patient was provided an opportunity to ask questions and all were answered. The patient agreed with the plan and demonstrated an understanding of the instructions.   The patient was advised to call back or seek an in-person evaluation if the symptoms worsen or if the condition fails to improve as anticipated.  I provided 20 minutes dedicated to the care of this patient via video on the date of this encounter to include chart review, face-to-face time with the patient, medication management/counseling, documentation, discussion of care with patient's grandmother.  Elsie Lincoln, MD 08/17/2023, 11:34 AM

## 2023-08-17 NOTE — Patient Instructions (Addendum)
We did not make any medication changes today.  When you see Dr. Adriana Simas, please make sure you get an EKG, lipid panel, A1c for baseline monitoring for potential start of Abilify.  Once I have those labs I will likely call in a 2 mg dose of Abilify (aripiprazole) for Reginald Ross to take first thing in the morning daily.  Please contact the autism Society of [place] for further resources available in the community for [name].

## 2023-08-21 ENCOUNTER — Encounter: Payer: Self-pay | Admitting: Family Medicine

## 2023-08-21 ENCOUNTER — Ambulatory Visit (INDEPENDENT_AMBULATORY_CARE_PROVIDER_SITE_OTHER): Payer: MEDICAID | Admitting: Family Medicine

## 2023-08-21 VITALS — BP 114/77 | HR 77 | Temp 97.6°F | Ht 70.0 in | Wt 192.0 lb

## 2023-08-21 DIAGNOSIS — Z79899 Other long term (current) drug therapy: Secondary | ICD-10-CM

## 2023-08-21 DIAGNOSIS — R9431 Abnormal electrocardiogram [ECG] [EKG]: Secondary | ICD-10-CM

## 2023-08-21 DIAGNOSIS — F422 Mixed obsessional thoughts and acts: Secondary | ICD-10-CM | POA: Diagnosis not present

## 2023-08-21 DIAGNOSIS — Z13220 Encounter for screening for lipoid disorders: Secondary | ICD-10-CM

## 2023-08-21 NOTE — Assessment & Plan Note (Signed)
Screening EKG done today at the recommendation of psychiatry.  Patient has short PR interval.  Spoke with cardiology.  Cardiology recommended repeat EKG in the future versus referral to EP.  I will reach out to EP to get their opinion the possibility of Wolff-Parkinson-White.

## 2023-08-21 NOTE — Progress Notes (Signed)
Subjective:  Patient ID: Reginald Ross, male    DOB: 1996-03-07  Age: 27 y.o. MRN: [id number]  CC:   Chief Complaint  Patient presents with   mental health follow up    Pt does not voice any concerns or changes at this time    HPI:  27 year old male presents for follow-up.  Patient has underlying autism.  Also has OCD.  Has recently been seen psychiatry.  Has had improvement in obsessive/intrusive thoughts.  He is on hydroxyzine and Zoloft.  Psychiatry planning to start Abilify and would like EKG, A1c, and lipid panel before starting.  Will order these today and forward to psychiatry.  Patient has no complaints or concerns at this time.  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 06/14/2023   Autism 03/31/2022   OCD (obsessive compulsive disorder) 03/31/2022    Social Hx   Social History   Socioeconomic History   Marital status: Single    Spouse name: Not on file   Number of children: Not on file   Years of education: Not on file   Highest education level: Not on file  Occupational History   Not on file  Tobacco Use   Smoking status: Never   Smokeless tobacco: Never   Tobacco comments:    Tried smoking once but did not like it  Substance and Sexual Activity   Alcohol use: Not Currently    Comment: Tried once and did not like it   Drug use: Never   Sexual activity: Not on file  Other Topics Concern   Not on file  Social History Narrative   Not on file   Social Determinants of Health   Financial Resource Strain: Not on file  Food Insecurity: Not on file  Transportation Needs: Not on file  Physical Activity: Not on file  Stress: Not on file  Social Connections: Not on file    Review of Systems Per  HPI  Objective:  BP 114/77   Pulse 77   Temp 97.6 F (36.4 C)   Ht 5\' 10"  (1.778 m)   Wt 192 lb (87.1 kg)   SpO2 99%   BMI 27.55 kg/m      08/21/2023    8:19 AM 02/15/2023    8:32 AM 08/22/2022    9:55 AM  BP/Weight  Systolic BP 114 104 132  Diastolic BP 77 67 84  Wt. (Lbs) 192 199 206.8  BMI 27.55 kg/m2 28.55 kg/m2 29.67 kg/m2    Physical Exam Vitals and nursing note reviewed.  Constitutional:      General: He is not in acute distress.    Appearance: Normal appearance.  HENT:     Head: Normocephalic and atraumatic.  Eyes:     General:        Right eye: No discharge.        Left eye: No discharge.     Conjunctiva/sclera: Conjunctivae normal.  Cardiovascular:     Rate and Rhythm: Normal rate and regular rhythm.  Pulmonary:     Effort: Pulmonary effort is normal.     Breath sounds: Normal breath sounds.  Neurological:     Mental Status: He is alert.    EKG -sinus rhythm with a rate of 79.  Short PR interval.  No ST or T wave changes.  Assessment & Plan:   Problem List Items Addressed This Visit       Other   Shortened PR interval    Screening EKG done today at the recommendation of psychiatry.  Patient has short PR interval.  Spoke with cardiology.  Cardiology recommended repeat EKG in the future versus referral to EP.  I will reach out to EP to get their opinion the possibility of Wolff-Parkinson-White.      OCD (obsessive compulsive disorder) - Primary    Improved on Zoloft.  Psychiatry planning to start Abilify.  Baseline EKG today.  Also ordering labs.      Other Visit Diagnoses     High risk medication use       Relevant Orders   EKG 12-Lead (Completed)   CBC   CMP14+EGFR   Hemoglobin A1c   Screening, lipid       Relevant Orders   Lipid panel      Follow-up:  6 months  Layana Konkel Adriana Simas DO [facility]

## 2023-08-21 NOTE — Patient Instructions (Signed)
Labs at [facility] today.  I am forwarding labs to Dr. Adrian Blackwater. He plans on starting Abilify once labs return.  Follow up in 6 months.  Take care  Dr. Adriana Simas

## 2023-08-21 NOTE — Assessment & Plan Note (Signed)
Improved on Zoloft.  Psychiatry planning to start Abilify.  Baseline EKG today.  Also ordering labs.

## 2023-08-22 LAB — CMP14+EGFR
ALT: 22 [IU]/L (ref 0–44)
AST: 20 [IU]/L (ref 0–40)
Albumin: 5 g/dL (ref 4.3–5.2)
Alkaline Phosphatase: 133 [IU]/L — ABNORMAL HIGH (ref 44–121)
BUN/Creatinine Ratio: 14 (ref 9–20)
BUN: 12 mg/dL (ref 6–20)
Bilirubin Total: 0.7 mg/dL (ref 0.0–1.2)
CO2: 25 mmol/L (ref 20–29)
Calcium: 10.2 mg/dL (ref 8.7–10.2)
Chloride: 101 mmol/L (ref 96–106)
Creatinine, Ser: 0.86 mg/dL (ref 0.76–1.27)
Globulin, Total: 2.5 g/dL (ref 1.5–4.5)
Glucose: 95 mg/dL (ref 70–99)
Potassium: 3.9 mmol/L (ref 3.5–5.2)
Sodium: 142 mmol/L (ref 134–144)
Total Protein: 7.5 g/dL (ref 6.0–8.5)
eGFR: 123 mL/min/{1.73_m2} (ref >59)

## 2023-08-22 LAB — CBC
Hematocrit: 43.5 % (ref 37.5–51.0)
Hemoglobin: 13.9 g/dL (ref 13.0–17.7)
MCH: 28.3 pg (ref 26.6–33.0)
MCHC: 32 g/dL (ref 31.5–35.7)
MCV: 88 fL (ref 79–97)
Platelets: 294 10*3/uL (ref 150–450)
RBC: 4.92 x10E6/uL (ref 4.14–5.80)
RDW: 12.6 % (ref 11.6–15.4)
WBC: 9.3 10*3/uL (ref 3.4–10.8)

## 2023-08-22 LAB — LIPID PANEL
Chol/HDL Ratio: 3.6 ratio (ref 0.0–5.0)
Cholesterol, Total: 213 mg/dL — ABNORMAL HIGH (ref 100–199)
HDL: 59 mg/dL (ref >39)
LDL Chol Calc (NIH): 129 mg/dL — ABNORMAL HIGH (ref 0–99)
Triglycerides: 144 mg/dL (ref 0–149)
VLDL Cholesterol Cal: 25 mg/dL (ref 5–40)

## 2023-08-22 LAB — HEMOGLOBIN A1C
Est. average glucose Bld gHb Est-mCnc: 114 mg/dL
Hgb A1c MFr Bld: 5.6 % (ref 4.8–5.6)

## 2023-08-31 ENCOUNTER — Telehealth (HOSPITAL_COMMUNITY): Payer: Self-pay | Admitting: Psychiatry

## 2023-08-31 DIAGNOSIS — F321 Major depressive disorder, single episode, moderate: Secondary | ICD-10-CM

## 2023-08-31 DIAGNOSIS — F422 Mixed obsessional thoughts and acts: Secondary | ICD-10-CM

## 2023-08-31 MED ORDER — ARIPIPRAZOLE 2 MG PO TABS: 2.0000 mg | ORAL_TABLET | Freq: Every day | ORAL | 2 refills | Status: DC

## 2023-08-31 NOTE — Telephone Encounter (Signed)
Reviewed results of EKG with patient's PCP and should be safe to start Abilify 2 mg once daily.  Had front desk staff communicate with patient about this change.

## 2023-09-11 ENCOUNTER — Telehealth: Payer: Self-pay | Admitting: *Deleted

## 2023-09-11 NOTE — Telephone Encounter (Signed)
Reginald Sams, DO  Can we get the patient scheduled for an office visit and EKG next week please?

## 2023-09-18 ENCOUNTER — Encounter: Payer: Self-pay | Admitting: Family Medicine

## 2023-09-18 ENCOUNTER — Ambulatory Visit (INDEPENDENT_AMBULATORY_CARE_PROVIDER_SITE_OTHER): Payer: MEDICAID | Admitting: Family Medicine

## 2023-09-18 VITALS — BP 121/80 | HR 93 | Temp 97.5°F | Ht 70.0 in | Wt 199.0 lb

## 2023-09-18 DIAGNOSIS — Z79899 Other long term (current) drug therapy: Secondary | ICD-10-CM | POA: Diagnosis not present

## 2023-09-18 DIAGNOSIS — R9431 Abnormal electrocardiogram [ECG] [EKG]: Secondary | ICD-10-CM

## 2023-09-18 NOTE — Patient Instructions (Signed)
I sent a message to Psychiatry.  EKG unchanged.  Take care  Dr. Adriana Simas

## 2023-09-18 NOTE — Assessment & Plan Note (Signed)
Independent interpretation: Normal sinus rhythm at the rate of 98 with shortened PR interval.  Voltage criteria met for LVH.   EKG unchanged.  Forwarding to psychiatry.  Okay to continue Abilify.

## 2023-09-18 NOTE — Progress Notes (Signed)
Subjective:  Patient ID: Reginald Ross, male    DOB: [birth year]  Age: 27 y.o. MRN: [id number]  CC:   Chief Complaint  Patient presents with   medication follow up    EKG     HPI:  27 year old male presents for evaluation of the above.  Patient is currently under the care of psychiatry.  Abilify was recently added.  He had an EKG obtained prior to the start of therapy which revealed a shortened PR interval and possible LVH.  I reached out to cardiology regarding short PR interval.  They recommended no evaluation/intervention in the absence of symptoms.  Cardiology did recommend repeat EKG after starting therapy.  Patient states that overall he is doing well.  He does report having frequent headaches.  He is here today for an EKG in regards to continuation of therapy with Abilify.  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 06/14/2023   Autism 03/31/2022   OCD (obsessive compulsive disorder) 03/31/2022    Social Hx   Social History   Socioeconomic History   Marital status: Single    Spouse name: Not on file   Number of children: Not on file   Years of education: Not on file   Highest education level: Not on file  Occupational History   Not on file  Tobacco Use   Smoking status: Never   Smokeless tobacco: Never   Tobacco comments:    Tried smoking once but did not like it  Substance and Sexual Activity   Alcohol use: Not Currently    Comment: Tried once and did not like it   Drug use: Never   Sexual activity: Not on file  Other Topics Concern   Not on file  Social History Narrative   Not on file   Social Determinants of Health   Financial Resource Strain: Not on file  Food Insecurity: Not on file   Transportation Needs: Not on file  Physical Activity: Not on file  Stress: Not on file  Social Connections: Not on file    Review of Systems  Respiratory: Negative.    Cardiovascular: Negative.     Objective:  BP 121/80   Pulse 93   Temp (!) 97.5 F (36.4 C)   Ht 5\' 10"  (1.778 m)   Wt 199 lb (90.3 kg)   SpO2 95%   BMI 28.55 kg/m      09/18/2023    9:42 AM 08/21/2023    8:19 AM 02/15/2023    8:32 AM  BP/Weight  Systolic BP 121 114 104  Diastolic BP 80 77 67  Wt. (Lbs) 199 192 199  BMI 28.55 kg/m2 27.55 kg/m2 28.55 kg/m2    Physical Exam Vitals and nursing note reviewed.  Constitutional:      General: He is not in acute distress. HENT:     Head: Normocephalic and atraumatic.  Cardiovascular:     Rate and Rhythm: Normal rate and regular rhythm.  Pulmonary:     Effort: Pulmonary effort is normal.     Breath sounds: Normal breath sounds. No wheezing, rhonchi or rales.  Neurological:     Mental Status: He is alert.     Lab Results  Component Value Date   WBC 9.3 08/21/2023   HGB  13.9 08/21/2023   HCT 43.5 08/21/2023   PLT 294 08/21/2023   GLUCOSE 95 08/21/2023   CHOL 213 (H) 08/21/2023   TRIG 144 08/21/2023   HDL 59 08/21/2023   LDLCALC 129 (H) 08/21/2023   ALT 22 08/21/2023   AST 20 08/21/2023   NA 142 08/21/2023   K 3.9 08/21/2023   CL 101 08/21/2023   CREATININE 0.86 08/21/2023   BUN 12 08/21/2023   CO2 25 08/21/2023   HGBA1C 5.6 08/21/2023     Assessment & Plan:   Problem List Items Addressed This Visit       Other   Shortened PR interval - Primary    Independent interpretation: Normal sinus rhythm at the rate of 98 with shortened PR interval.  Voltage criteria met for LVH.   EKG unchanged.  Forwarding to psychiatry.  Okay to continue Abilify.      Other Visit Diagnoses     High risk medication use       Relevant Orders   EKG 12-Lead (Completed)       Jillyan Plitt Adriana Simas DO [facility]

## 2023-09-26 ENCOUNTER — Other Ambulatory Visit (HOSPITAL_COMMUNITY): Payer: Self-pay | Admitting: Psychiatry

## 2023-09-26 DIAGNOSIS — F422 Mixed obsessional thoughts and acts: Secondary | ICD-10-CM

## 2023-09-26 DIAGNOSIS — F321 Major depressive disorder, single episode, moderate: Secondary | ICD-10-CM

## 2023-09-28 ENCOUNTER — Telehealth (INDEPENDENT_AMBULATORY_CARE_PROVIDER_SITE_OTHER): Payer: MEDICAID | Admitting: Psychiatry

## 2023-09-28 ENCOUNTER — Encounter (HOSPITAL_COMMUNITY): Payer: Self-pay | Admitting: Psychiatry

## 2023-09-28 DIAGNOSIS — F422 Mixed obsessional thoughts and acts: Secondary | ICD-10-CM | POA: Diagnosis not present

## 2023-09-28 DIAGNOSIS — F411 Generalized anxiety disorder: Secondary | ICD-10-CM

## 2023-09-28 DIAGNOSIS — F410 Panic disorder [episodic paroxysmal anxiety] without agoraphobia: Secondary | ICD-10-CM

## 2023-09-28 DIAGNOSIS — Z789 Other specified health status: Secondary | ICD-10-CM

## 2023-09-28 DIAGNOSIS — F321 Major depressive disorder, single episode, moderate: Secondary | ICD-10-CM | POA: Diagnosis not present

## 2023-09-28 DIAGNOSIS — F840 Autistic disorder: Secondary | ICD-10-CM | POA: Diagnosis not present

## 2023-09-28 DIAGNOSIS — G4726 Circadian rhythm sleep disorder, shift work type: Secondary | ICD-10-CM

## 2023-09-28 DIAGNOSIS — Z79899 Other long term (current) drug therapy: Secondary | ICD-10-CM | POA: Insufficient documentation

## 2023-09-28 MED ORDER — ARIPIPRAZOLE 2 MG PO TABS: 2.0000 mg | ORAL_TABLET | Freq: Every day | ORAL | 2 refills | Status: DC

## 2023-09-28 MED ORDER — SERTRALINE HCL 100 MG PO TABS: 200.0000 mg | ORAL_TABLET | Freq: Every day | ORAL | 2 refills | Status: DC

## 2023-09-28 NOTE — Patient Instructions (Signed)
We did not make any medication changes today.  When you get a chance please contact the autism Society of [place] for further resources in your community.

## 2023-09-28 NOTE — Progress Notes (Signed)
BH MD Outpatient Progress Note  09/28/2023 8:28 AM Reginald Ross  MRN:  [id number]  Assessment:  [name] presents for follow-up evaluation. Today, 09/28/23, patient reports improving of OCD obsessive thoughts and compulsive behaviors in the setting of titration of max dose sertraline and addition of Abilify.  Both are still occurring but he is able to enjoy some of his games a little bit more now and is not having side effects with Abilify.  Short PR interval was found on EKG baseline monitoring but did not worsen with addition of Abilify.  Patient's grandmother still helping monitor medication and will try to connect him with [employer] [place] resources but so far has not done this to date.  Consistently no suicidal ideation while being on Zoloft and still the case with Abilify. He is not involved in psychotherapy which will also be necessary to address his OCD.  Still struggling with concept of cutting back on caffeine and still at 2-3 Cokes per day.  Follow-up in 1 month.   For safety, his acute risk factors for suicide are: Diagnosis of OCD and depression.  His chronic risk factors for suicide are: Unemployment, chronic mental illness, firearms in the home.  His protective factors are: Supportive family, no suicidal ideation in session today, actively seeking and engaging with mental health care, does not know how to access to firearms in the home, hope for the future.  While future events cannot be fully predicted he does not currently meet IVC criteria and can be continued as an outpatient.  Identifying Information: Reginald Ross is a 27 y.o. male with a history of autism, OCD, generalized anxiety disorder with panic attacks, caffeine overuse, shift phase insomnia with snoring, major depressive disorder, possible learning disability who is an established patient with [facility]. Initial evaluation of OCD and anxiety on 06/14/23; please see that  note for full case formulation. Fluoxetine appeared to be helpful for anxiety in combination with hydroxyzine but per patient and patient's mother had not led to significant change in OCD symptoms.  He had previously been trialed on citalopram as well so do not imagine that monotherapy with sertraline will be fully effective and will plan on adding either Abilify or risperidone which has decent data in OCD and autism populations.       Plan:   # Autism spectrum disorder with OCD Past medication trials: Fluoxetine, citalopram, hydroxyzine Status of problem: Improving Interventions: -- Continue sertraline 200 mg once daily (i9/30/24) and consider further titration to 250mg  for OCD dosing -- Patient to contact the autism [employer] [place] for further behavioral resources and psychotherapy -- Continue Abilify 2 mg once daily (s11/14/24)   # Generalized anxiety disorder with panic attacks  caffeine overuse Past medication trials:  Status of problem: Improving Interventions: -- sertraline, psychotherapy as above --Continue hydroxyzine 25 mg twice daily as needed for panic --Patient to cut back on caffeine use   # Major depressive disorder, single episode, moderate now without intermittent passive suicidal ideation Past medication trials:  Status of problem: Improving Interventions: -- sertraline, Abilify, psychotherapy as above   # Shift phase sleep disorder Past medication trials:  Status of problem: improving Interventions: -- Patient to cut back on caffeine --CBT-I  # Long-term current use of antipsychotic  shortened PR interval  dyslipidemia Past medication trials:  Status of problem: Chronic and stable Interventions: -- Lipid panel and A1c up-to-date as of 08/21/2023 and found shortened PR interval of and QTc  of but stable on 10/15/2023 with PR and QTc of and after starting Abilify  Patient was given contact information for behavioral health  clinic and was instructed to call 911 for emergencies.   Subjective:  Chief Complaint:  Chief Complaint  Patient presents with   Autism   OCD   Follow-up   Anxiety   Depression    Interval History: Things have been great, thinks he is making progress. Enjoys playing his games a little better like monopoly go. Still has both obsessions and compulsions but thinks they are a little better with abilify. Still has to read everything in his games but still can't watch Youtube again. Utilizing different no caffeine drinks like root beer and still having 2-3 cokes per day. Sleep is great and has been getting up earlier; still 7-8hrs per night. Still paces but not as much as he used to. No constipation or muscle stiffness. Still denies having SI when directly asked. Still not connected with autism society.  Grandmother not available for collateral today.  Visit Diagnosis:    ICD-10-CM   1. Autism  F84.0     2. Mixed obsessional thoughts and acts  F42.2 ARIPiprazole (ABILIFY) 2 MG tablet    sertraline (ZOLOFT) 100 MG tablet    3. Major depressive disorder, single episode, moderate (HCC)  F32.1 ARIPiprazole (ABILIFY) 2 MG tablet    sertraline (ZOLOFT) 100 MG tablet    4. Generalized anxiety disorder with panic attacks  F41.1 sertraline (ZOLOFT) 100 MG tablet   F41.0     5. Caffeine overuse  Z78.9     6. 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 Long term current use of antipsychotic medication  Z79.899        Past Psychiatric History:  Diagnoses: autism, OCD, generalized anxiety disorder with panic attacks, caffeine overuse, shift phase insomnia with snoring, major depressive disorder, possible learning disability Medication trials: fluoxetine (ineffective at 80mg  daily), hydroxyzine (effective), citalopram (ineffective), Zoloft (partially effective), Abilify (effective) Previous psychiatrist/therapist: yes to both Hospitalizations: none Suicide attempts:  none SIB: sometimes will slap head in frustration Hx of violence towards others: none Current access to guns: yes secured in gun safe but doesn't know how to access Hx of trauma/abuse: none Substance use: none  Past Medical History:  Past Medical History:  Diagnosis Date   Asperger syndrome     Past Surgical History:  Procedure Laterality Date   ABDOMINAL SURGERY     muscle surgery as an infant    Family Psychiatric History: none known   Family History:  Family History  Family history unknown: Yes    Social History:  Academic/Vocational: none currently, used to work at [employer]   Social History   Socioeconomic History   Marital status: Single    Spouse name: Not on file   Number of children: Not on file   Years of education: Not on file   Highest education level: Not on file  Occupational History   Not on file  Tobacco Use   Smoking status: Never   Smokeless tobacco: Never   Tobacco comments:    Tried smoking once but did not like it  Substance and Sexual Activity   Alcohol use: Not Currently    Comment: Tried once and did not like it   Drug use: Never   Sexual activity: Not on file  Other Topics Concern   Not on file  Social History Narrative  Not on file   Social Drivers of Health   Financial Resource Strain: Not on file  Food Insecurity: Not on file  Transportation Needs: Not on file  Physical Activity: Not on file  Stress: Not on file  Social Connections: Not on file    Allergies: No Known Allergies  Current Medications: Current Outpatient Medications  Medication Sig Dispense Refill   ARIPiprazole (ABILIFY) 2 MG tablet Take 1 tablet (2 mg total) by mouth daily. 30 tablet 2   hydrOXYzine (ATARAX) 25 MG tablet Take 25 mg by mouth 2 (two) times daily as needed.     sertraline (ZOLOFT) 100 MG tablet Take 2 tablets (200 mg total) by mouth daily. 60 tablet 2   No current facility-administered medications for this visit.    ROS: Review  of Systems  Constitutional:  Positive for appetite change. Negative for unexpected weight change.  Gastrointestinal:  Negative for constipation, diarrhea, nausea and vomiting.  Endocrine: Positive for heat intolerance. Negative for cold intolerance and polyphagia.  Musculoskeletal:  Negative for arthralgias, back pain and myalgias.  Neurological:  Positive for headaches.  Psychiatric/Behavioral:  Positive for sleep disturbance. Negative for decreased concentration, dysphoric mood, hallucinations, self-injury and suicidal ideas. The patient is nervous/anxious.        Obsessive thoughts and compulsive behaviors    Objective:  Psychiatric Specialty Exam: There were no vitals taken for this visit.There is no height or weight on file to calculate BMI.  General Appearance: Casual, Fairly Groomed, and wearing a hat.  Appears stated age  Eye Contact:  Minimal  Speech:   Slight impairment to articulation.  Slight stutter.  Fast rate but interruptible  Volume:  Increased  Mood:   "I feel great"  Affect:  Appropriate, Congruent, and anxious.  Spontaneous smile  Thought Content: Logical, Hallucinations: None, Obsessions, and Rumination   Suicidal Thoughts:   None  Homicidal Thoughts:  No  Thought Process:  Descriptions of Associations: Tangential, concrete  Orientation:  Full (Time, Place, and Person)    Memory:  Grossly intact   Judgment:  Other:  Chronically limited  Insight:   Chronically limited  Concentration:  Concentration: Poor  Recall:  not formally assessed   Fund of Knowledge: Poor  Language: Poor  Psychomotor Activity:  Increased  Akathisia:  No  AIMS (if indicated): not done  Assets:  Communication Skills Desire for Improvement Financial Resources/Insurance Housing Leisure Time Physical Health Social Support Talents/Skills  ADL's:  Impaired  Cognition: WNL  Sleep:  Fair   PE: General: sits comfortably in view of camera; no acute distress  Pulm: no increased work of  breathing on room air  MSK: all extremity movements appear intact  Neuro: no focal neurological deficits observed  Gait & Station: unable to assess by video    Metabolic Disorder Labs: Lab Results  Component Value Date   HGBA1C 5.6 08/21/2023   No results found for: "PROLACTIN" Lab Results  Component Value Date   CHOL 213 (H) 08/21/2023   TRIG 144 08/21/2023   HDL 59 08/21/2023   CHOLHDL 3.6 08/21/2023   LDLCALC 129 (H) 08/21/2023   No results found for: "TSH"  Therapeutic Level Labs: No results found for: "LITHIUM" No results found for: "VALPROATE" No results found for: "CBMZ"  Screenings:  GAD-7    Flowsheet Row Office Visit from 09/18/2023 in [facility]  Total GAD-7 Score 12      PHQ2-9    Flowsheet Row Office Visit from 09/18/2023 in [facility] [place] Office Visit from 06/14/2023 in [facility] at [facility] Total Score 1 5  PHQ-9 Total Score 7 12      Flowsheet Row Office Visit from 06/14/2023 in [facility] [place] ED from 11/09/2020 in [facility] at [facility]  C-SSRS RISK CATEGORY Low Risk No Risk       Collaboration of Care: Collaboration of Care: Medication Management AEB as above, Primary Care Provider AEB as above, and Community Stakeholder(s) AEB as above  Patient/Guardian was advised Release of Information must be obtained prior to any record release in order to collaborate their care with an outside provider. Patient/Guardian was advised if they have not already done so to contact the registration department to sign all necessary forms in order for [place] to release information regarding their care.   Consent: Patient/Guardian gives verbal consent for treatment and assignment of benefits for services provided during this visit. Patient/Guardian expressed understanding and agreed to proceed.   Televisit via  video: I connected with patient on 09/28/23 at  8:00 AM EST by a video enabled telemedicine application and verified that I am speaking with the correct person using two identifiers.  Location: Patient: [facility] Provider: remote office in [place]   I discussed the limitations of evaluation and management by telemedicine and the availability of in person appointments. The patient expressed understanding and agreed to proceed.  I discussed the assessment and treatment plan with the patient. The patient was provided an opportunity to ask questions and all were answered. The patient agreed with the plan and demonstrated an understanding of the instructions.   The patient was advised to call back or seek an in-person evaluation if the symptoms worsen or if the condition fails to improve as anticipated.  I provided 20 minutes dedicated to the care of this patient via video on the date of this encounter to include chart review, face-to-face time with the patient, medication management/counseling, documentation.  Elsie Lincoln, MD 09/28/2023, 8:28 AM

## 2023-10-26 ENCOUNTER — Telehealth (HOSPITAL_COMMUNITY): Payer: MEDICAID | Admitting: Psychiatry

## 2023-11-13 ENCOUNTER — Encounter (HOSPITAL_COMMUNITY): Payer: Self-pay | Admitting: Psychiatry

## 2023-11-13 ENCOUNTER — Telehealth (HOSPITAL_COMMUNITY): Payer: MEDICAID | Admitting: Psychiatry

## 2023-11-13 DIAGNOSIS — F840 Autistic disorder: Secondary | ICD-10-CM

## 2023-11-13 DIAGNOSIS — Z789 Other specified health status: Secondary | ICD-10-CM

## 2023-11-13 DIAGNOSIS — F321 Major depressive disorder, single episode, moderate: Secondary | ICD-10-CM | POA: Diagnosis not present

## 2023-11-13 DIAGNOSIS — F422 Mixed obsessional thoughts and acts: Secondary | ICD-10-CM

## 2023-11-13 DIAGNOSIS — F1590 Other stimulant use, unspecified, uncomplicated: Secondary | ICD-10-CM

## 2023-11-13 DIAGNOSIS — F411 Generalized anxiety disorder: Secondary | ICD-10-CM

## 2023-11-13 DIAGNOSIS — Z79899 Other long term (current) drug therapy: Secondary | ICD-10-CM

## 2023-11-13 DIAGNOSIS — G4726 Circadian rhythm sleep disorder, shift work type: Secondary | ICD-10-CM

## 2023-11-13 DIAGNOSIS — F410 Panic disorder [episodic paroxysmal anxiety] without agoraphobia: Secondary | ICD-10-CM

## 2023-11-13 MED ORDER — SERTRALINE HCL 100 MG PO TABS: 250.0000 mg | ORAL_TABLET | Freq: Every day | ORAL | 2 refills | Status: DC

## 2023-11-13 NOTE — Progress Notes (Signed)
BH MD Outpatient Progress Note  11/13/2023 2:21 PM Reginald Ross  MRN:  [id number]  Assessment:  Reginald Ross presents for follow-up evaluation. Today, 11/13/23, patient reports no change with OCD symptoms but this could correlate with patient's report that he has never taken Abilify and until this can be ascertained with grandmother's assistance will increase Zoloft to OCD dosing range for now.  Would plan to titrate Abilify if cannot confirm he has been on this medication as well.  This does also coincide with grandmother being busier of late and patient reverting back to his old caffeine use and sleep patterns.  Pharmacy records would indicate the patient has been at least feeling the Abilify and that would have corresponded with being able to enjoy his games slightly more but with his autism it is hard to tell.  Patient's grandmother still helping monitor medication and will try to connect him with autism [employer] of [place] resources but so far has not done this to date.  Consistently no suicidal ideation while being on Zoloft and potentially still the case with Abilify. He is not involved in psychotherapy which will also be necessary to address his OCD.  Follow-up in 1 month.   For safety, his acute risk factors for suicide are: Diagnosis of OCD and depression.  His chronic risk factors for suicide are: Unemployment, chronic mental illness, firearms in the home.  His protective factors are: Supportive family, no suicidal ideation in session today, actively seeking and engaging with mental health care, does not know how to access to firearms in the home, hope for the future.  While future events cannot be fully predicted he does not currently meet IVC criteria and can be continued as an outpatient.  Identifying Information: Reginald Ross is a 28 y.o. male with a history of autism, OCD, generalized anxiety disorder with panic attacks, caffeine overuse, shift phase insomnia with snoring,  major depressive disorder, possible learning disability who is an established patient with [facility]. Initial evaluation of OCD and anxiety on 06/14/23; please see that note for full case formulation. Fluoxetine appeared to be helpful for anxiety in combination with hydroxyzine but per patient and patient's mother had not led to significant change in OCD symptoms.  He had previously been trialed on citalopram as well so do not imagine that monotherapy with sertraline will be fully effective and will plan on adding either Abilify or risperidone which has decent data in OCD and autism populations.  Short PR interval was found on EKG baseline monitoring but did not worsen with addition of Abilify.     Plan:   # Autism spectrum disorder with OCD Past medication trials: Fluoxetine, citalopram, hydroxyzine Status of problem: Not improving as expected Interventions: -- Titrate sertraline to 250 mg once daily (i9/30/24, i1/28/25) for OCD dosing -- Patient to contact the autism [employer] of [place] for further behavioral resources and psychotherapy -- Continue Abilify 2 mg once daily (s11/14/24) with plan to titrate in the future if can confirm patient is actually taking this   # Generalized anxiety disorder with panic attacks  caffeine overuse Past medication trials:  Status of problem: Worsening Interventions: -- sertraline, psychotherapy as above --Continue hydroxyzine 25 mg twice daily as needed for panic --Patient to cut back on caffeine use   # Major depressive disorder, single episode, moderate now without intermittent passive suicidal ideation Past medication trials:  Status of problem: Improving Interventions: -- sertraline, Abilify, psychotherapy as above   # Shift phase sleep  disorder Past medication trials:  Status of problem: improving Interventions: -- Patient to cut back on caffeine --CBT-I  # Long-term current use of antipsychotic  shortened PR  interval  dyslipidemia Past medication trials:  Status of problem: Chronic and stable Interventions: -- Lipid panel and A1c up-to-date as of 08/21/2023 and found shortened PR interval of and QTc of but stable on 10/15/2023 with PR and QTc of and after starting Abilify  Patient was given contact information for behavioral health clinic and was instructed to call 911 for emergencies.   Subjective:  Chief Complaint:  Chief Complaint  Patient presents with   Autism   Follow-up   OCD   Anxiety    Interval History: Things have been about the same and finding he is still reading the same things on video game menus over and over again which has been frustrating.  Has been enjoying them a bit more though than he was. Has been throwing items in the trash over and over again (removes and puts back in). Still can't watch Youtube for similar reasons. For sleep has reverted a bit with playing video games and plays late into the night and wakes up at 3p. Has tried to keep cutting back but unfortunately this was the caffeine free sodas and back up to more than 4 per day. Still no constipation or muscle stiffness. Hasn't connected with autism society yet, grandmother has been busy. Hydroxyzine still for anxiety which has been getting better but using twice daily everyday. When planning titration of abilify, he doesn't think he has ever taken this medication which conflicts with pharmacy records showing filled for the last 3 months. Will instead titrate zoloft.  Grandmother couldn't be present today and he utilized Marine scientist.   Visit Diagnosis:    ICD-10-CM   1. Autism  F84.0     2. Mixed obsessional thoughts and acts  F42.2 sertraline (ZOLOFT) 100 MG tablet    3. Major depressive disorder, single episode, moderate (HCC)  F32.1 sertraline (ZOLOFT) 100 MG tablet    4. Generalized anxiety disorder with panic attacks  F41.1 sertraline (ZOLOFT) 100 MG tablet   F41.0      5. Long term current use of antipsychotic medication  Z79.899     6. Caffeine overuse  Z78.9     7. 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  G47.26         Past Psychiatric History:  Diagnoses: autism, OCD, generalized anxiety disorder with panic attacks, caffeine overuse, shift phase insomnia with snoring, major depressive disorder, possible learning disability Medication trials: fluoxetine (ineffective at 80mg  daily), hydroxyzine (effective), citalopram (ineffective), Zoloft (partially effective), Abilify (effective but unclear if he has been taking this) Previous psychiatrist/therapist: yes to both Hospitalizations: none Suicide attempts: none SIB: sometimes will slap head in frustration Hx of violence towards others: none Current access to guns: yes secured in gun safe but doesn't know how to access Hx of trauma/abuse: none Substance use: none  Past Medical History:  Past Medical History:  Diagnosis Date   Asperger syndrome     Past Surgical History:  Procedure Laterality Date   ABDOMINAL SURGERY     muscle surgery as an infant    Family Psychiatric History: none known   Family History:  Family History  Family history unknown: Yes    Social History:  Academic/Vocational: none currently, used to work at [employer]   Social History   Socioeconomic History   Marital status: Single  Spouse name: Not on file   Number of children: Not on file   Years of education: Not on file   Highest education level: Not on file  Occupational History   Not on file  Tobacco Use   Smoking status: Never   Smokeless tobacco: Never   Tobacco comments:    Tried smoking once but did not like it  Substance and Sexual Activity   Alcohol use: Not Currently    Comment: Tried once and did not like it   Drug use: Never   Sexual activity: Not on file  Other Topics Concern   Not on file  Social History Narrative   Not on file   Social Drivers of Health    Financial Resource Strain: Not on file  Food Insecurity: Not on file  Transportation Needs: Not on file  Physical Activity: Not on file  Stress: Not on file  Social Connections: Not on file    Allergies: No Known Allergies  Current Medications: Current Outpatient Medications  Medication Sig Dispense Refill   ARIPiprazole (ABILIFY) 2 MG tablet Take 1 tablet (2 mg total) by mouth daily. 30 tablet 2   hydrOXYzine (ATARAX) 25 MG tablet Take 25 mg by mouth 2 (two) times daily as needed.     sertraline (ZOLOFT) 100 MG tablet Take 2.5 tablets (250 mg total) by mouth daily. 75 tablet 2   No current facility-administered medications for this visit.    ROS: Review of Systems  Constitutional:  Positive for appetite change. Negative for unexpected weight change.  Gastrointestinal:  Negative for constipation, diarrhea, nausea and vomiting.  Endocrine: Positive for heat intolerance. Negative for cold intolerance and polyphagia.  Musculoskeletal:  Negative for arthralgias, back pain and myalgias.  Neurological:  Positive for headaches.  Psychiatric/Behavioral:  Positive for sleep disturbance. Negative for decreased concentration, dysphoric mood, hallucinations, self-injury and suicidal ideas. The patient is nervous/anxious.        Obsessive thoughts and compulsive behaviors    Objective:  Psychiatric Specialty Exam: There were no vitals taken for this visit.There is no height or weight on file to calculate BMI.  General Appearance: Casual, Fairly Groomed, and wearing a hat.  Appears stated age  Eye Contact:  Minimal  Speech:   Slight impairment to articulation.  Slight stutter.  Fast rate but interruptible  Volume:  Increased  Mood:   "About the same"  Affect:  Appropriate, Congruent, and anxious and slightly labile.  Spontaneous smile  Thought Content: Logical, Hallucinations: None, Obsessions, and Rumination   Suicidal Thoughts:   None  Homicidal Thoughts:  No  Thought Process:   Descriptions of Associations: Tangential, concrete  Orientation:  Full (Time, Place, and Person)    Memory:  Grossly intact   Judgment:  Other:  Chronically limited  Insight:   Chronically limited  Concentration:  Concentration: Poor  Recall:  not formally assessed   Fund of Knowledge: Poor  Language: Poor  Psychomotor Activity:  Increased  Akathisia:  No  AIMS (if indicated): not done  Assets:  Communication Skills Desire for Improvement Financial Resources/Insurance Housing Leisure Time Physical Health Social Support Talents/Skills  ADL's:  Impaired  Cognition: WNL  Sleep:  Poor   PE: General: sits comfortably in view of camera; no acute distress  Pulm: no increased work of breathing on room air  MSK: all extremity movements appear intact  Neuro: no focal neurological deficits observed  Gait & Station: unable to assess by video    Metabolic Disorder Labs: Lab Results  Component Value Date   HGBA1C 5.6 08/21/2023   No results found for: "PROLACTIN" Lab Results  Component Value Date   CHOL 213 (H) 08/21/2023   TRIG 144 08/21/2023   HDL 59 08/21/2023   CHOLHDL 3.6 08/21/2023   LDLCALC 129 (H) 08/21/2023   No results found for: "TSH"  Therapeutic Level Labs: No results found for: "LITHIUM" No results found for: "VALPROATE" No results found for: "CBMZ"  Screenings:  GAD-7    Flowsheet Row Office Visit from 09/18/2023 in [facility]  Total GAD-7 Score 12      PHQ2-9    Flowsheet Row Office Visit from 09/18/2023 in [facility] Office Visit from 06/14/2023 in [facility] at [facility] Total Score 1 5  PHQ-9 Total Score 7 12      Flowsheet Row Office Visit from 06/14/2023 in [facility] [place] ED from 11/09/2020 in [facility] at [facility]  C-SSRS RISK CATEGORY Low Risk No Risk       Collaboration of  Care: Collaboration of Care: Medication Management AEB as above, Primary Care Provider AEB as above, and Community Stakeholder(s) AEB as above  Patient/Guardian was advised Release of Information must be obtained prior to any record release in order to collaborate their care with an outside provider. Patient/Guardian was advised if they have not already done so to contact the registration department to sign all necessary forms in order for [place] to release information regarding their care.   Consent: Patient/Guardian gives verbal consent for treatment and assignment of benefits for services provided during this visit. Patient/Guardian expressed understanding and agreed to proceed.   Televisit via video: I connected with patient on 11/13/23 at  2:00 PM EST by a video enabled telemedicine application and verified that I am speaking with the correct person using two identifiers.  Location: Patient: [facility] Provider: remote office in    I discussed the limitations of evaluation and management by telemedicine and the availability of in person appointments. The patient expressed understanding and agreed to proceed.  I discussed the assessment and treatment plan with the patient. The patient was provided an opportunity to ask questions and all were answered. The patient agreed with the plan and demonstrated an understanding of the instructions.   The patient was advised to call back or seek an in-person evaluation if the symptoms worsen or if the condition fails to improve as anticipated.  I provided 20 minutes dedicated to the care of this patient via video on the date of this encounter to include chart review, face-to-face time with the patient, medication management/counseling, documentation.  Elsie Lincoln, MD 11/13/2023, 2:21 PM

## 2023-11-13 NOTE — Patient Instructions (Addendum)
Please confirm that Reginald Ross has been taking Abilify (aripiprazole) 2 mg once daily and let me know.  Until we can confirm if he has been taking this or not I cannot increase the dose of it.  In the meantime, I will increase the sertraline (Zoloft) to 250 mg once daily today which is a more typical OCD dosing range.  Please assist Reginald Ross with connecting with the autism Society of [place].  He also requires some assistance with coming back on caffeine which has increased again.

## 2023-12-06 ENCOUNTER — Other Ambulatory Visit (HOSPITAL_COMMUNITY): Payer: Self-pay | Admitting: Psychiatry

## 2023-12-06 DIAGNOSIS — F321 Major depressive disorder, single episode, moderate: Secondary | ICD-10-CM

## 2023-12-06 DIAGNOSIS — F410 Panic disorder [episodic paroxysmal anxiety] without agoraphobia: Secondary | ICD-10-CM

## 2023-12-06 DIAGNOSIS — F422 Mixed obsessional thoughts and acts: Secondary | ICD-10-CM

## 2023-12-11 ENCOUNTER — Encounter (HOSPITAL_COMMUNITY): Payer: Self-pay | Admitting: Psychiatry

## 2023-12-11 ENCOUNTER — Telehealth (INDEPENDENT_AMBULATORY_CARE_PROVIDER_SITE_OTHER): Payer: MEDICAID | Admitting: Psychiatry

## 2023-12-11 DIAGNOSIS — F840 Autistic disorder: Secondary | ICD-10-CM

## 2023-12-11 DIAGNOSIS — F321 Major depressive disorder, single episode, moderate: Secondary | ICD-10-CM

## 2023-12-11 DIAGNOSIS — F422 Mixed obsessional thoughts and acts: Secondary | ICD-10-CM

## 2023-12-11 DIAGNOSIS — F410 Panic disorder [episodic paroxysmal anxiety] without agoraphobia: Secondary | ICD-10-CM

## 2023-12-11 DIAGNOSIS — G4726 Circadian rhythm sleep disorder, shift work type: Secondary | ICD-10-CM

## 2023-12-11 DIAGNOSIS — F1590 Other stimulant use, unspecified, uncomplicated: Secondary | ICD-10-CM | POA: Diagnosis not present

## 2023-12-11 DIAGNOSIS — Z79899 Other long term (current) drug therapy: Secondary | ICD-10-CM

## 2023-12-11 DIAGNOSIS — Z789 Other specified health status: Secondary | ICD-10-CM

## 2023-12-11 DIAGNOSIS — F411 Generalized anxiety disorder: Secondary | ICD-10-CM

## 2023-12-11 MED ORDER — ARIPIPRAZOLE 5 MG PO TABS: 5.0000 mg | ORAL_TABLET | Freq: Every day | ORAL | 2 refills | Status: DC

## 2023-12-11 NOTE — Patient Instructions (Addendum)
 We increased the Abilify (aripiprazole) to 5 mg once daily today.  Please keep trying to cut back on caffeine and this is likely making the OCD and anxiety worse.  Please contact your insurer to find a therapist that works with OCD as this will be the other half of your treatment plan.

## 2023-12-11 NOTE — Progress Notes (Signed)
 BH MD Outpatient Progress Note  12/11/2023 10:26 AM Reginald Ross  MRN:  [id number]  Assessment:  Reginald Ross presents for follow-up evaluation. Today, 12/11/23, patient reports no change with OCD symptoms with titration of Zoloft to 250 mg daily.  His grandmother again was unable to be present today so compliance with medication changes is still somewhat intention but we will plan on titration of Abilify given fill records indicate that he has been taking this medication.  His lack of improvement with the obsessions and compulsions does also coincide with grandmother being busier of late and patient reverting back to his old caffeine use and sleep patterns.  Patient's grandmother still helping monitor medication and will try to connect him with autism [employer] [place] resources but so far has not done this to date.  Consistently no suicidal ideation while being on Zoloft and potentially still the case with Abilify. He is not involved in psychotherapy which will also be necessary to address his OCD.  Follow-up in 1 month.   For safety, his acute risk factors for suicide are: Diagnosis of OCD and depression.  His chronic risk factors for suicide are: Unemployment, chronic mental illness, firearms in the home.  His protective factors are: Supportive family, no suicidal ideation in session today, actively seeking and engaging with mental health care, does not know how to access to firearms in the home, hope for the future.  While future events cannot be fully predicted he does not currently meet IVC criteria and can be continued as an outpatient.  Identifying Information: Reginald Ross is a 28 y.o. male with a history of autism, OCD, generalized anxiety disorder with panic attacks, caffeine overuse, shift phase insomnia with snoring, major depressive disorder, possible learning disability who is an established patient with [facility]. Initial evaluation of OCD and  anxiety on 06/14/23; please see that note for full case formulation. Fluoxetine appeared to be helpful for anxiety in combination with hydroxyzine but per patient and patient's mother had not led to significant change in OCD symptoms.  He had previously been trialed on citalopram as well so do not imagine that monotherapy with sertraline will be fully effective and will plan on adding either Abilify or risperidone which has decent data in OCD and autism populations.  Short PR interval was found on EKG baseline monitoring but did not worsen with addition of Abilify.     Plan:   # Autism spectrum disorder with OCD Past medication trials: Fluoxetine, citalopram, hydroxyzine Status of problem: Not improving as expected Interventions: -- Titrate sertraline to 250 mg once daily (i9/30/24, i1/28/25) for OCD dosing -- Patient to contact the autism [employer] [place] for further behavioral resources and psychotherapy -- Titrate Abilify to 5 mg once daily (s11/14/24, i2/25/25) with plan to titrate in the future if can confirm patient is actually taking this   # Generalized anxiety disorder with panic attacks  caffeine overuse Past medication trials:  Status of problem: Not improving as expected Interventions: -- sertraline, psychotherapy as above --Continue hydroxyzine 25 mg twice daily as needed for panic --Patient to cut back on caffeine use   # Major depressive disorder, single episode, moderate now without intermittent passive suicidal ideation Past medication trials:  Status of problem: Improving Interventions: -- sertraline, Abilify, psychotherapy as above   # 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   Subjective:  Chief Complaint:  Chief Complaint  Patient presents with   Autism   OCD   Anxiety   Depression   Follow-up    Interval History: Says no when asked how things are going. Says things have been a wild ride with trying to play his video games and got stuck on the menu screen with OCD. Says this was the second time with having to stop playing it. Things have been about the same overall and has been throwing items in the trash over and over again (removes and puts back in). Still can't watch Youtube for similar reasons. Still no change with the caffeine intake (4+ sodas per day) but says that he is sleeping fine and sleeping from 12a-2p. Still no constipation or muscle stiffness. Hasn't connected with autism society yet, grandmother has been busy. Hydroxyzine still for anxiety which has been getting better but using twice daily everyday. When planning titration of abilify, reviewed again medication record and while he struggles with remembering the names of medication does match fill history and is working with grandmother for compliance.  Grandmother couldn't be present today and he utilized Marine scientist.   Visit Diagnosis:    ICD-10-CM   1. Autism  F84.0     2. Mixed obsessional thoughts and acts  F42.2 ARIPiprazole (ABILIFY) 5 MG tablet    3. Major depressive disorder, single episode, moderate (HCC)  F32.1 ARIPiprazole (ABILIFY) 5 MG tablet    4. Caffeine overuse  Z78.9     5. Generalized anxiety disorder with panic attacks  F41.1    F41.0     6. Long term current use of antipsychotic medication  Z79.899     7. 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   G47.26          Past Psychiatric History:  Diagnoses: autism, OCD, generalized anxiety disorder with panic attacks, caffeine overuse, shift phase insomnia with snoring, major depressive disorder, possible learning disability Medication trials: fluoxetine (ineffective at 80mg  daily), hydroxyzine (effective), citalopram (ineffective), Zoloft (partially effective), Abilify (effective but unclear if he has been taking this) Previous psychiatrist/therapist: yes to both Hospitalizations: none Suicide attempts: none SIB: sometimes will slap head in frustration Hx of violence towards others: none Current access to guns: yes secured in gun safe but doesn't know how to access Hx of trauma/abuse: none Substance use: none  Past Medical History:  Past Medical History:  Diagnosis Date   Asperger syndrome     Past Surgical History:  Procedure Laterality Date   ABDOMINAL SURGERY     muscle surgery as an infant    Family Psychiatric History: none known   Family History:  Family History  Family history unknown: Yes    Social History:  Academic/Vocational: none currently, used to work at [employer]   Social History   Socioeconomic History   Marital status: Single    Spouse name: Not on file   Number of children: Not on file   Years of education: Not on file   Highest education level: Not on file  Occupational History   Not on file  Tobacco Use   Smoking status: Never   Smokeless tobacco: Never   Tobacco comments:    Tried smoking once but did not like it  Substance and Sexual Activity   Alcohol use: Not Currently    Comment: Tried once and did not like it   Drug use: Never   Sexual activity: Not on file  Other Topics Concern   Not on file  Social History Narrative   Not on file   Social Drivers of Health   Financial Resource Strain: Not on file  Food Insecurity: Not on file  Transportation Needs: Not on file  Physical Activity: Not on file  Stress: Not on file   Social Connections: Not on file    Allergies: No Known Allergies  Current Medications: Current Outpatient Medications  Medication Sig Dispense Refill   ARIPiprazole (ABILIFY) 5 MG tablet Take 1 tablet (5 mg total) by mouth daily. 30 tablet 2   hydrOXYzine (ATARAX) 25 MG tablet Take 25 mg by mouth 2 (two) times daily as needed.     sertraline (ZOLOFT) 100 MG tablet TAKE 2.5 TABLETS BY MOUTH DAILY. 225 tablet 0   No current facility-administered medications for this visit.    ROS: Review of Systems  Constitutional:  Positive for appetite change. Negative for unexpected weight change.  Gastrointestinal:  Negative for constipation, diarrhea, nausea and vomiting.  Endocrine: Positive for heat intolerance. Negative for cold intolerance and polyphagia.  Musculoskeletal:  Negative for arthralgias, back pain and myalgias.  Neurological:  Positive for headaches.  Psychiatric/Behavioral:  Positive for sleep disturbance. Negative for decreased concentration, dysphoric mood, hallucinations, self-injury and suicidal ideas. The patient is nervous/anxious.        Obsessive thoughts and compulsive behaviors    Objective:  Psychiatric Specialty Exam: There were no vitals taken for this visit.There is no height or weight on file to calculate BMI.  General Appearance: Casual, Fairly Groomed, and wearing a hat.  Appears stated age  Eye Contact:  Minimal  Speech:   Slight impairment to articulation.  Slight stutter.  Fast rate but interruptible  Volume:  Increased  Mood:   "No"  Affect:  Appropriate, Congruent, and anxious and slightly labile.  Spontaneous smile  Thought Content: Logical, Hallucinations: None, Obsessions, and Rumination   Suicidal Thoughts:   None  Homicidal Thoughts:  No  Thought Process:  Descriptions of Associations: Tangential, concrete  Orientation:  Full (Time, Place, and Person)    Memory:  Grossly intact   Judgment:  Other:  Chronically limited  Insight:   Chronically  limited  Concentration:  Concentration: Poor  Recall:  not formally assessed   Fund of Knowledge: Poor  Language: Poor  Psychomotor Activity:  Increased  Akathisia:  No  AIMS (if indicated): not done  Assets:  Communication Skills Desire for Improvement Financial Resources/Insurance Housing Leisure Time Physical Health Social Support Talents/Skills  ADL's:  Impaired  Cognition: WNL  Sleep:  Poor   PE: General: sits comfortably in view of camera; no acute distress  Pulm: no increased work of breathing on room air  MSK: all extremity movements appear intact  Neuro: no focal neurological deficits observed  Gait & Station: unable to assess by video    Metabolic Disorder Labs: Lab Results  Component Value Date   HGBA1C 5.6 08/21/2023   No results found for: "PROLACTIN" Lab Results  Component Value Date   CHOL 213 (H) 08/21/2023   TRIG 144 08/21/2023  HDL 59 08/21/2023   CHOLHDL 3.6 08/21/2023   LDLCALC 129 (H) 08/21/2023   No results found for: "TSH"  Therapeutic Level Labs: No results found for: "LITHIUM" No results found for: "VALPROATE" No results found for: "CBMZ"  Screenings:  GAD-7    Flowsheet Row Office Visit from 09/18/2023 in [facility]  Total GAD-7 Score 12      PHQ2-9    Flowsheet Row Office Visit from 09/18/2023 in [facility] Office Visit from 06/14/2023 in [facility] Total Score 1 5  PHQ-9 Total Score 7 12      Flowsheet Row Office Visit from 06/14/2023 in [facility] [place] [facility] from 11/09/2020 in [facility] at [facility]  C-SSRS RISK CATEGORY Low Risk No Risk       Collaboration of Care: Collaboration of Care: Medication Management AEB as above, Primary Care Provider AEB as above, and Community Stakeholder(s) AEB as above  Patient/Guardian was advised Release of  Information must be obtained prior to any record release in order to collaborate their care with an outside provider. Patient/Guardian was advised if they have not already done so to contact the registration department to sign all necessary forms in order for [place] to release information regarding their care.   Consent: Patient/Guardian gives verbal consent for treatment and assignment of benefits for services provided during this visit. Patient/Guardian expressed understanding and agreed to proceed.   Televisit via video: I connected with patient on 12/11/23 at 10:00 AM EST by a video enabled telemedicine application and verified that I am speaking with the correct person using two identifiers.  Location: Patient: [facility] Provider: remote office in [place]   I discussed the limitations of evaluation and management by telemedicine and the availability of in person appointments. The patient expressed understanding and agreed to proceed.  I discussed the assessment and treatment plan with the patient. The patient was provided an opportunity to ask questions and all were answered. The patient agreed with the plan and demonstrated an understanding of the instructions.   The patient was advised to call back or seek an in-person evaluation if the symptoms worsen or if the condition fails to improve as anticipated.  I provided 20 minutes dedicated to the care of this patient via video on the date of this encounter to include chart review, face-to-face time with the patient, medication management/counseling, documentation.  Elsie Lincoln, MD 12/11/2023, 10:26 AM

## 2024-01-02 ENCOUNTER — Other Ambulatory Visit (HOSPITAL_COMMUNITY): Payer: Self-pay | Admitting: Psychiatry

## 2024-01-02 DIAGNOSIS — F321 Major depressive disorder, single episode, moderate: Secondary | ICD-10-CM

## 2024-01-02 DIAGNOSIS — F422 Mixed obsessional thoughts and acts: Secondary | ICD-10-CM

## 2024-01-08 ENCOUNTER — Encounter (HOSPITAL_COMMUNITY): Payer: Self-pay | Admitting: Psychiatry

## 2024-01-08 ENCOUNTER — Telehealth (INDEPENDENT_AMBULATORY_CARE_PROVIDER_SITE_OTHER): Payer: MEDICAID | Admitting: Psychiatry

## 2024-01-08 DIAGNOSIS — F410 Panic disorder [episodic paroxysmal anxiety] without agoraphobia: Secondary | ICD-10-CM

## 2024-01-08 DIAGNOSIS — F422 Mixed obsessional thoughts and acts: Secondary | ICD-10-CM | POA: Diagnosis not present

## 2024-01-08 DIAGNOSIS — F840 Autistic disorder: Secondary | ICD-10-CM | POA: Diagnosis not present

## 2024-01-08 DIAGNOSIS — G4726 Circadian rhythm sleep disorder, shift work type: Secondary | ICD-10-CM

## 2024-01-08 DIAGNOSIS — F321 Major depressive disorder, single episode, moderate: Secondary | ICD-10-CM

## 2024-01-08 DIAGNOSIS — F411 Generalized anxiety disorder: Secondary | ICD-10-CM

## 2024-01-08 DIAGNOSIS — Z789 Other specified health status: Secondary | ICD-10-CM

## 2024-01-08 DIAGNOSIS — Z79899 Other long term (current) drug therapy: Secondary | ICD-10-CM

## 2024-01-08 MED ORDER — VENLAFAXINE HCL ER 75 MG PO CP24: 75.0000 mg | ORAL_CAPSULE | Freq: Every day | ORAL | 1 refills | Status: DC

## 2024-01-08 NOTE — Progress Notes (Signed)
 BH MD Outpatient Progress Note  01/08/2024 10:54 AM [name]  MRN:  [id number]  Assessment:  Reginald Ross presents for follow-up evaluation. Today, 01/08/24, patient reports no change with OCD symptoms with titration of Zoloft to 250 mg daily outside of intermittent improvement to throwing trash away repeatedly and being able to fall asleep easier with less obsessive thoughts about sentences.  Functionally during the day however he still spends 2 to 3 hours on compulsions and more time obsessing and still not able to engage in activities that he previously enjoyed.  Despite this, both anxiety and depression were responding well to the increased dose of Zoloft.  However would consider the 250 mg trial of failure at this point and we will switch to Effexor XR as outlined in plan below.  His grandmother again was unable to be present today so compliance with medication changes is still somewhat unknown.  Pharmacy records appear consistent with him taking increased dose of Abilify which could correspond with improvement as seen above.  Caffeine intake is still excessive and likely contributing to ongoing obsessive thought.  Patient's grandmother still helping monitor medication and will try to connect him with autism [employer] of [place] resources but so far has not done this to date, however did try to get set up with a therapist unfortunately this person does not treat OCD so he is still looking.  Consistently no suicidal ideation while being on Zoloft and potentially still the case with Abilify. Follow-up in 1 month.  Provider transition discussed.   For safety, his acute risk factors for suicide are: Diagnosis of OCD and depression.  His chronic risk factors for suicide are: Unemployment, chronic mental illness, firearms in the home.  His protective factors are: Supportive family, no suicidal ideation in session today, actively seeking and engaging with mental health care, does not know how  to access to firearms in the home, hope for the future.  While future events cannot be fully predicted he does not currently meet IVC criteria and can be continued as an outpatient.  Identifying Information: Reginald Ross is a 28 y.o. male with a history of autism, OCD, generalized anxiety disorder with panic attacks, caffeine overuse, shift phase insomnia with snoring, major depressive disorder, possible learning disability who is an established patient with [facility]. Initial evaluation of OCD and anxiety on 06/14/23; please see that note for full case formulation. Fluoxetine appeared to be helpful for anxiety in combination with hydroxyzine but per patient and patient's mother had not led to significant change in OCD symptoms.  He had previously been trialed on citalopram as well so do not imagine that monotherapy with sertraline will be fully effective and will plan on adding either Abilify or risperidone which has decent data in OCD and autism populations.  Short PR interval was found on EKG baseline monitoring but did not worsen with addition of Abilify.     Plan:   # Autism spectrum disorder with OCD Past medication trials: Fluoxetine, citalopram, hydroxyzine Status of problem: Not improving as expected Interventions: -- Cross-taper sertraline to 200mg  (2 pills) daily for 5 days. Then decrease to 100mg  (1 pill) daily for one week.  Then discontinue (i9/30/24, i1/28/25, d3/25/25, d3/31/25) -- Once on the 100mg  of sertraline start venlafaxine XR 75mg  daily for one week. Once you finish this week, you can discontinue the sertraline and increase the venlafaxine XR to 150mg  (2 pills) daily thereafter  (s3/31/25, i4/8/25) -- Patient to contact the autism Society of  [facility] for further behavioral resources and psychotherapy -- Continue Abilify 5 mg once daily (s11/14/24, i2/25/25) with plan to titrate in the future if can confirm patient is actually taking this   #  Generalized anxiety disorder with panic attacks  caffeine overuse Past medication trials:  Status of problem: Chronic and stable Interventions: -- sertraline, venlafaxine XR, psychotherapy as above --Continue hydroxyzine 25 mg twice daily as needed for panic --Patient to cut back on caffeine use   # Major depressive disorder, single episode, moderate now without intermittent passive suicidal ideation Past medication trials:  Status of problem: Improving Interventions: -- sertraline, venlafaxine XR, Abilify, psychotherapy as above   # 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   Subjective:  Chief Complaint:  Chief Complaint  Patient presents with   Autism   OCD   Follow-up   Anxiety   Depression    Interval History: Hasn't noticed a difference with the OCD with the increase in zoloft and only was able to watch YouTube once. Did notice some improvement to sleep. Says he isn't focusing on OCD when he is in bed now. The repetitive thoughts about sentences are less when going to sleep. Still getting stuck during the day on video game menus but does note that he didn't have to repeatedly throw away trash as much as before. Thinks about 2-3hrs per day are still spent on compulsions. Does feel less anxious and depressed. Does feel anxiety is no longer an issue. However, hydroxyzine still for anxiety  which has been getting better but using twice daily everyday. The therapist he found doesn't work with OCD so is still looking. Still no change with the caffeine intake (4+ sodas per day) but says that he is sleeping fine and sleeping from 12a-2p. Still no constipation or muscle stiffness. Hasn't connected with autism society yet, grandmother has been busy.  Did have an episode of vomiting last week with head spinning but not sure what happened. Reviewed plan for switch from zoloft to   Grandmother couldn't be present today and he utilized [facility] transport.   Visit Diagnosis:    ICD-10-CM   1. Autism  F84.0     2. Mixed obsessional thoughts and acts  F42.2 venlafaxine XR (EFFEXOR XR) 75 MG 24 hr capsule    3. Generalized anxiety disorder with panic attacks  F41.1 venlafaxine XR (EFFEXOR XR) 75 MG 24 hr capsule   F41.0     4. Caffeine overuse  Z78.9     5. Major depressive disorder, single episode, moderate (HCC)  F32.1 venlafaxine XR (EFFEXOR XR) 75 MG 24 hr capsule    6. Long term current use of antipsychotic medication  Z79.899     7. 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  G47.26           Past Psychiatric History:  Diagnoses: autism, OCD, generalized anxiety disorder with panic attacks, caffeine overuse, shift phase insomnia with snoring,  major depressive disorder, possible learning disability Medication trials: fluoxetine (ineffective at 80mg  daily), hydroxyzine (effective), citalopram (ineffective), Zoloft (partially effective), Abilify (effective but unclear if he has been taking this) Previous psychiatrist/therapist: yes to both Hospitalizations: none Suicide attempts: none SIB: sometimes will slap head in frustration Hx of violence towards others: none Current access to guns: yes secured in gun safe but doesn't know how to access Hx of trauma/abuse: none Substance use: none  Past Medical History:  Past Medical History:  Diagnosis Date   Asperger  syndrome     Past Surgical History:  Procedure Laterality Date   ABDOMINAL SURGERY     muscle surgery as an infant    Family Psychiatric History: none known   Family History:  Family History  Family history unknown: Yes    Social History:  Academic/Vocational: none currently, used to work at [employer]   Social History   Socioeconomic History   Marital status: Single    Spouse name: Not on file   Number of children: Not on file   Years of education: Not on file   Highest education level: Not on file  Occupational History   Not on file  Tobacco Use   Smoking status: Never   Smokeless tobacco: Never   Tobacco comments:    Tried smoking once but did not like it  Substance and Sexual Activity   Alcohol use: Not Currently    Comment: Tried once and did not like it   Drug use: Never   Sexual activity: Not on file  Other Topics Concern   Not on file  Social History Narrative   Not on file   Social Drivers of Health   Financial Resource Strain: Not on file  Food Insecurity: Not on file  Transportation Needs: Not on file  Physical Activity: Not on file  Stress: Not on file  Social Connections: Not on file    Allergies: No Known Allergies  Current Medications: Current Outpatient Medications  Medication Sig Dispense Refill   [START ON 01/14/2024] venlafaxine XR (EFFEXOR XR) 75 MG 24 hr capsule Take 1 capsule (75 mg total) by mouth daily with breakfast. For 1 week then increase to 150 mg daily with breakfast thereafter. 60 capsule 1   ARIPiprazole (ABILIFY) 5 MG tablet TAKE 1 TABLET (5 MG TOTAL) BY MOUTH DAILY. 90 tablet 1   hydrOXYzine (ATARAX) 25 MG tablet Take 25 mg by mouth 2 (two) times daily as needed.     No current facility-administered medications for this visit.    ROS: Review of Systems  Constitutional:  Positive for appetite change. Negative for unexpected weight change.  Gastrointestinal:  Negative for constipation, diarrhea, nausea and  vomiting.  Endocrine: Positive for heat intolerance. Negative for cold intolerance and polyphagia.  Musculoskeletal:  Negative for arthralgias, back pain and myalgias.  Neurological:  Positive for headaches.  Psychiatric/Behavioral:  Positive for sleep disturbance. Negative for decreased concentration, dysphoric mood, hallucinations, self-injury and suicidal ideas. The patient is nervous/anxious.        Obsessive thoughts and compulsive behaviors    Objective:  Psychiatric Specialty Exam: There were no vitals taken for this visit.There is no height or weight on file to calculate BMI.  General Appearance: Casual, Fairly Groomed, and wearing a hat.  Appears stated age  Eye Contact:  Minimal  Speech:   Slight impairment to articulation.  Slight stutter.  Fast rate but interruptible  Volume:  Increased  Mood:   "My OCD is not getting better"  Affect:  Appropriate, Congruent, and anxious and slightly labile but calmer than previous.  Spontaneous smile  Thought Content: Logical, Hallucinations: None, Obsessions, and Rumination   Suicidal Thoughts:   None  Homicidal Thoughts:  No  Thought Process:  Descriptions of Associations: Tangential, concrete  Orientation:  Full (Time, Place, and Person)    Memory:  Grossly intact   Judgment:  Other:  Chronically limited  Insight:   Chronically limited  Concentration:  Concentration: Poor  Recall:  not formally assessed   Fund of Knowledge: Poor  Language: Poor  Psychomotor Activity:  Increased  Akathisia:  No  AIMS (if indicated): not done  Assets:  Communication Skills Desire for Improvement Financial Resources/Insurance Housing Leisure Time Physical Health Social Support Talents/Skills  ADL's:  Impaired  Cognition: WNL  Sleep:  Poor but stable   PE: General: sits comfortably in view of camera; no acute distress  Pulm: no increased work of breathing on room air  MSK: all extremity movements appear intact  Neuro: no focal  neurological deficits observed  Gait & Station: unable to assess by video    Metabolic Disorder Labs: Lab Results  Component Value Date   HGBA1C 5.6 08/21/2023   No results found for: "PROLACTIN" Lab Results  Component Value Date   CHOL 213 (H) 08/21/2023   TRIG 144 08/21/2023   HDL 59 08/21/2023   CHOLHDL 3.6 08/21/2023   LDLCALC 129 (H) 08/21/2023   No results found for: "TSH"  Therapeutic Level Labs: No results found for: "LITHIUM" No results found for: "VALPROATE" No results found for: "CBMZ"  Screenings:  GAD-7    Flowsheet Row Office Visit from 09/18/2023 in [facility]  Total GAD-7 Score 12      PHQ2-9    Flowsheet Row Office Visit from 09/18/2023 in [facility] Office Visit from 06/14/2023 in [facility] at [facility] Total Score 1 5  PHQ-9 Total Score 7 12      Flowsheet Row Office Visit from 06/14/2023 in [facility] at [place] [facility] from 11/09/2020 in [facility] at [facility]  C-SSRS RISK CATEGORY Low Risk No Risk       Collaboration of Care: Collaboration of Care: Medication Management AEB as above, Primary Care Provider AEB as above, and Community Stakeholder(s) AEB as above  Patient/Guardian was advised Release of Information must be obtained prior to any record release in order to collaborate their care with an outside provider. Patient/Guardian was advised if they have not already done so to contact the registration department to sign all necessary forms in order for [place] to release information regarding their care.   Consent: Patient/Guardian gives verbal consent for treatment and assignment of benefits for services provided during this visit. Patient/Guardian expressed understanding and agreed to proceed.   Televisit via video: I connected with patient on 01/08/24 at 10:30 AM EDT by a video enabled  telemedicine application and verified that I am speaking with the correct person using two identifiers.  Location: Patient: [facility] Provider: remote office in [place]   I discussed the limitations of evaluation and management by telemedicine and the availability of in person appointments. The patient expressed understanding and agreed to proceed.  I discussed the assessment and treatment plan with the patient. The patient was provided an opportunity to ask questions and all were answered. The patient agreed with the plan and demonstrated an understanding of the instructions.  The patient was advised to call back or seek an in-person evaluation if the symptoms worsen or if the condition fails to improve as anticipated.  I provided 20 minutes dedicated to the care of this patient via video on the date of this encounter to include chart review, face-to-face time with the patient, medication management/counseling, documentation.  Elsie Lincoln, MD 01/08/2024, 10:54 AM

## 2024-01-08 NOTE — Patient Instructions (Signed)
 Decrease sertraline to 200mg  (2 pills) daily for 5 days. Then decrease to 100mg  (1 pill) daily for one week. Once on the 100mg  of sertraline start venlafaxine XR 75mg  daily for one week. Once you finish this week, you can discontinue the sertraline and increase the venlafaxine XR to 150mg  (2 pills) daily thereafter.

## 2024-01-30 ENCOUNTER — Other Ambulatory Visit (HOSPITAL_COMMUNITY): Payer: Self-pay | Admitting: Psychiatry

## 2024-01-30 DIAGNOSIS — F410 Panic disorder [episodic paroxysmal anxiety] without agoraphobia: Secondary | ICD-10-CM

## 2024-01-30 DIAGNOSIS — F422 Mixed obsessional thoughts and acts: Secondary | ICD-10-CM

## 2024-01-30 DIAGNOSIS — F321 Major depressive disorder, single episode, moderate: Secondary | ICD-10-CM

## 2024-02-12 ENCOUNTER — Telehealth (HOSPITAL_COMMUNITY): Payer: MEDICAID | Admitting: Psychiatry

## 2024-02-12 ENCOUNTER — Encounter (HOSPITAL_COMMUNITY): Payer: Self-pay | Admitting: Psychiatry

## 2024-02-12 DIAGNOSIS — F840 Autistic disorder: Secondary | ICD-10-CM

## 2024-02-12 DIAGNOSIS — F422 Mixed obsessional thoughts and acts: Secondary | ICD-10-CM

## 2024-02-12 DIAGNOSIS — F410 Panic disorder [episodic paroxysmal anxiety] without agoraphobia: Secondary | ICD-10-CM

## 2024-02-12 DIAGNOSIS — F321 Major depressive disorder, single episode, moderate: Secondary | ICD-10-CM

## 2024-02-12 DIAGNOSIS — Z79899 Other long term (current) drug therapy: Secondary | ICD-10-CM

## 2024-02-12 DIAGNOSIS — F411 Generalized anxiety disorder: Secondary | ICD-10-CM | POA: Diagnosis not present

## 2024-02-12 DIAGNOSIS — Z789 Other specified health status: Secondary | ICD-10-CM

## 2024-02-12 MED ORDER — HYDROXYZINE HCL 25 MG PO TABS: 25.0000 mg | ORAL_TABLET | Freq: Two times a day (BID) | ORAL | 5 refills | Status: AC | PRN

## 2024-02-12 MED ORDER — VENLAFAXINE HCL ER 150 MG PO CP24: 150.0000 mg | ORAL_CAPSULE | Freq: Every day | ORAL | 1 refills | Status: AC

## 2024-02-12 MED ORDER — VENLAFAXINE HCL ER 75 MG PO CP24: 75.0000 mg | ORAL_CAPSULE | Freq: Every day | ORAL | 1 refills | Status: AC

## 2024-02-12 NOTE — Progress Notes (Signed)
 BH MD Outpatient Progress Note  02/12/2024 10:56 AM [name]  MRN:  [id number]  Assessment:  Reginald Ross presents for follow-up evaluation. Today, 02/12/24, patient reports some improvement with cross-taper of Zoloft  to venlafaxine  XR and showing more change than even OCD dosing of Zoloft  previously.  We will therefore titrate venlafaxine  XR today but will avoid going to a 300 mg dose as I will not be able to follow up with the patient.  He is no longer throwing trash away repeatedly but still struggles with videogame menus with repetitively reading things and adjusting something he calls a water bowl.  Functionally during the day however he still spends 2 to 3 hours on compulsions and more time obsessing.  As with Zoloft  the venlafaxine  XR has led to a slight improvement to both anxiety and depression.  Ongoing exacerbation from excessive caffeine intake which is now at an unknown level as he is no longer keeping track.  His grandmother again was unable to be present today so compliance with medication changes is still somewhat unknown.  Pharmacy records appear consistent with him taking increased dose of Abilify  which could correspond with improvement as seen above and could consider titration in the future.  Patient's grandmother still helping monitor medication and will try to connect him with autism Society of [place]  resources but so far has not done this to date, however did try to get set up with a therapist unfortunately this person does not treat OCD so he is still looking and will see a new one on Friday.  Consistently no suicidal ideation while he was on Zoloft  and potentially still the case with Abilify  and venlafaxine  XR.  No follow-up planned due to provider transition.   For safety, his acute risk factors for suicide are: Diagnosis of OCD and depression.  His chronic risk factors for suicide are: Unemployment, chronic mental illness, firearms in the home.  His protective  factors are: Supportive family, no suicidal ideation in session today, actively seeking and engaging with mental health care, does not know how to access to firearms in the home, hope for the future.  While future events cannot be fully predicted he does not currently meet IVC criteria and can be continued as an outpatient.  Identifying Information: Reginald Ross is a 28 y.o. male with a history of autism, OCD, generalized anxiety disorder with panic attacks, caffeine overuse, shift phase insomnia with snoring, major depressive disorder, possible learning disability who is an established patient with [facility]. Initial evaluation of OCD and anxiety on 06/14/23; please see that note for full case formulation. Fluoxetine  appeared to be helpful for anxiety in combination with hydroxyzine but per patient and patient's mother had not led to significant change in OCD symptoms.  He had previously been trialed on citalopram as well so do not imagine that monotherapy with sertraline  will be fully effective and will plan on adding either Abilify  or risperidone which has decent data in OCD and autism populations.  Short PR interval was found on EKG baseline monitoring but did not worsen with addition of Abilify .     Plan:   # Autism spectrum disorder with OCD Past medication trials: Fluoxetine , citalopram, hydroxyzine Status of problem: improving Interventions: -- titrate venlafaxine  XR to 150mg  (3 pills) daily (s3/31/25, i4/8/25, i4/29/25) -- Patient to contact the autism Society of [place]  for further behavioral resources and psychotherapy -- Continue Abilify  5 mg once daily (s11/14/24, i2/25/25) with plan to titrate in the future if  can confirm patient is actually taking this   # Generalized anxiety disorder with panic attacks  caffeine overuse Past medication trials:  Status of problem: Chronic and stable Interventions: -- venlafaxine  XR, psychotherapy as  above --Continue hydroxyzine 25 mg twice daily as needed for panic --Patient to cut back on caffeine use   # Major depressive disorder, single episode, moderate now without intermittent passive suicidal ideation Past medication trials:  Status of problem: Improving Interventions: -- venlafaxine  XR, Abilify , psychotherapy as above   # 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   Subjective:  Chief Complaint:  Chief Complaint  Patient presents with   Autism   OCD   Depression   Anxiety   Follow-up    Interval History: Things have been fine and has been able to play his games again and reading the menu repeatedly hasn't stopped him from playing. Tolerated the medication switch well and isn't noting any side effects. Does still engage in repetitive behaviors outside of the above with washing his hands repeatedly but is no longer throwing the trash away repeatedly. Still staying awake at night with an unknown amount of caffeine from soda per day with a flipped day night schedule. Overall does feel less anxious and depressed. Still takes hydroxyzine twice daily everyday which he says are for many things. Will see new therapist on Friday for the OCD. Still no constipation or muscle stiffness. Hasn't connected with autism society yet, grandmother has been busy.    Grandmother couldn't be present today  and he utilized [employer].   Visit Diagnosis:    ICD-10-CM   1. Autism  F84.0     2. Mixed obsessional thoughts and acts  F42.2 venlafaxine  XR (EFFEXOR -XR) 75 MG 24 hr capsule    venlafaxine  XR (EFFEXOR -XR) 150 MG 24 hr capsule    3. Generalized anxiety disorder with panic attacks  F41.1 venlafaxine  XR (EFFEXOR -XR) 75 MG 24 hr capsule   F41.0 venlafaxine  XR (EFFEXOR -XR) 150 MG 24 hr capsule    hydrOXYzine (ATARAX) 25 MG tablet    4. Major depressive disorder, single episode, moderate (HCC)  F32.1 venlafaxine  XR (EFFEXOR -XR) 75 MG 24 hr capsule    venlafaxine  XR (EFFEXOR -XR) 150 MG 24 hr capsule    5. Caffeine use  Z78.9     6. Long term current use of antipsychotic medication  Z79.899        Past Psychiatric History:  Diagnoses: autism, OCD, generalized anxiety disorder with panic attacks, caffeine overuse, shift phase insomnia with snoring, major depressive disorder, possible learning disability Medication trials: fluoxetine  (ineffective at 80mg  daily), hydroxyzine (effective), citalopram (ineffective), Zoloft  (partially effective), Abilify  (effective but unclear if he has been taking this) Previous psychiatrist/therapist: yes to both Hospitalizations: none Suicide attempts: none SIB: sometimes will slap head in frustration Hx of violence towards others: none Current access to guns: yes secured in gun safe but doesn't know how to access Hx of trauma/abuse: none Substance  use: none  Past Medical History:  Past Medical History:  Diagnosis Date   Asperger syndrome     Past Surgical History:  Procedure Laterality Date   ABDOMINAL SURGERY     muscle surgery as an infant    Family Psychiatric History: none known   Family History:  Family History  Family history unknown: Yes    Social History:  Academic/Vocational: none currently, used to work at [employer]   Social History   Socioeconomic History   Marital status: Single    Spouse name: Not on file    Number of children: Not on file   Years of education: Not on file   Highest education level: Not on file  Occupational History   Not on file  Tobacco Use   Smoking status: Never   Smokeless tobacco: Never   Tobacco comments:    Tried smoking once but did not like it  Substance and Sexual Activity   Alcohol use: Not Currently    Comment: Tried once and did not like it   Drug use: Never   Sexual activity: Not on file  Other Topics Concern   Not on file  Social History Narrative   Not on file   Social Drivers of Health   Financial Resource Strain: Not on file  Food Insecurity: Not on file  Transportation Needs: Not on file  Physical Activity: Not on file  Stress: Not on file  Social Connections: Not on file    Allergies: No Known Allergies  Current Medications: Current Outpatient Medications  Medication Sig Dispense Refill   venlafaxine  XR (EFFEXOR -XR) 75 MG 24 hr capsule Take 1 capsule (75 mg total) by mouth daily with breakfast. Take with 150mg  capsule daily. 90 capsule 1   ARIPiprazole  (ABILIFY ) 5 MG tablet TAKE 1 TABLET (5 MG TOTAL) BY MOUTH DAILY. 90 tablet 1   hydrOXYzine (ATARAX) 25 MG tablet Take 1 tablet (25 mg total) by mouth 2 (two) times daily as needed for anxiety. 60 tablet 5   venlafaxine  XR (EFFEXOR -XR) 150 MG 24 hr capsule Take 1 capsule (150 mg total) by mouth daily with breakfast. Take with 150mg  capsule daily. 90 capsule 1   No current facility-administered medications for this visit.    ROS: Review of Systems  Constitutional:  Positive for appetite change. Negative for unexpected weight change.  Gastrointestinal:  Negative for constipation, diarrhea, nausea and vomiting.  Endocrine: Positive for heat intolerance. Negative for cold intolerance and polyphagia.  Musculoskeletal:  Negative for arthralgias, back pain and myalgias.  Neurological:  Positive for headaches.  Psychiatric/Behavioral:  Positive for sleep disturbance. Negative for decreased  concentration, dysphoric mood, hallucinations, self-injury and suicidal ideas. The patient is nervous/anxious.        Obsessive thoughts and compulsive behaviors    Objective:  Psychiatric Specialty Exam: There were no vitals taken for this visit.There is no height or weight on file to calculate BMI.  General Appearance: Casual, Fairly Groomed, and appears stated age  Eye Contact:  Minimal  Speech:   Slight impairment to articulation.  Slight stutter.  Fast rate but interruptible  Volume:  Increased  Mood:   "A little better"  Affect:  Appropriate, Congruent, and anxious and slightly labile but calmer than previous.  Spontaneous smile  Thought Content: Logical, Hallucinations: None, Obsessions, and Rumination   Suicidal Thoughts:   None  Homicidal Thoughts:  No  Thought Process:  Descriptions of Associations: Tangential, concrete  Orientation:  Full (Time, Place, and Person)  Memory:  Grossly intact   Judgment:  Other:  Chronically limited  Insight:   Chronically limited  Concentration:  Concentration: Poor  Recall:  not formally assessed   Fund of Knowledge: Poor  Language: Poor  Psychomotor Activity:  Increased  Akathisia:  No  AIMS (if indicated): not done due to limitations of telehealth  Assets:  Communication Skills Desire for Improvement Financial Resources/Insurance Housing Leisure Time Physical Health Social Support Talents/Skills  ADL's:  Impaired  Cognition: WNL  Sleep:  Poor but stable   PE: General: sits comfortably in view of camera; no acute distress  Pulm: no increased work of breathing on room air  MSK: all extremity movements appear intact  Neuro: no focal neurological deficits observed  Gait & Station: unable to assess by video    Metabolic Disorder Labs: Lab Results  Component Value Date   HGBA1C 5.6 08/21/2023   No results found for: "PROLACTIN" Lab Results  Component Value Date   CHOL 213 (H) 08/21/2023   TRIG 144 08/21/2023   HDL 59  08/21/2023   CHOLHDL 3.6 08/21/2023   LDLCALC 129 (H) 08/21/2023   No results found for: "TSH"  Therapeutic Level Labs: No results found for: "LITHIUM" No results found for: "VALPROATE" No results found for: "CBMZ"  Screenings:  GAD-7    Flowsheet Row Office Visit from 09/18/2023 in [facility]  Total GAD-7 Score 12      PHQ2-9    Flowsheet Row Office Visit from 09/18/2023 in [facility] Office Visit from 06/14/2023 in [facility] at [facility] Total Score 1 5  PHQ-9 Total Score 7 12      Flowsheet Row Office Visit from 06/14/2023 in [facility] [place] [facility] from 11/09/2020 in [facility]  C-SSRS RISK CATEGORY Low Risk No Risk       Collaboration of Care: Collaboration of Care: Medication Management AEB as above, Primary Care Provider AEB as above, and Community Stakeholder(s) AEB as above  Patient/Guardian was advised Release of Information must be obtained prior to any record release in order to collaborate their care with an outside provider. Patient/Guardian was advised if they have not already done so to contact the registration department to sign all necessary forms in order for us  to release information regarding their care.   Consent: Patient/Guardian gives verbal consent for treatment and assignment of benefits for services provided during this visit. Patient/Guardian expressed understanding and agreed to proceed.   Televisit via video: I connected with patient on 02/12/24 at 10:30 AM EDT by a video enabled telemedicine application and verified that I am speaking with the correct person using two identifiers.  Location: Patient: Reginald Ross behavioral health Provider: remote office in [place]   I discussed the limitations of evaluation and management by telemedicine and the availability of in person  appointments. The patient expressed understanding and agreed to proceed.  I discussed the assessment and treatment plan with the patient. The patient was provided an opportunity to ask questions and all were answered. The patient agreed with the plan and demonstrated an understanding of the instructions.   The patient was advised to call back or seek an in-person evaluation if the symptoms worsen or if the condition fails to improve as anticipated.  I provided 20 minutes dedicated to the care of this patient via video on the date of this encounter to include chart review, face-to-face time  with the patient, medication management/counseling, documentation.  Madie Schilling, MD 02/12/2024, 10:56 AM

## 2024-02-12 NOTE — Patient Instructions (Signed)
 We increased the venlafaxine  XR to a total dose of 225 mg once daily today.  Terryn will take this dose by combining a 150 mg capsule with a 75 mg capsule once daily.  Please keep trying to cut back on caffeine intake and that should help with the OCD and anxiety and insomnia as well.  Please try to establish with a new psychiatrist as soon as you can in local options could be [facility] in [place], [facility] in [place], and there are [facility] health offices and [place], [place] and [place] that you could check into.

## 2024-02-19 ENCOUNTER — Ambulatory Visit: Payer: MEDICAID | Admitting: Family Medicine

## 2024-02-19 ENCOUNTER — Encounter: Payer: Self-pay | Admitting: Family Medicine

## 2024-02-19 VITALS — BP 118/78 | HR 78 | Temp 97.1°F | Ht 70.0 in | Wt 219.0 lb

## 2024-02-19 DIAGNOSIS — F422 Mixed obsessional thoughts and acts: Secondary | ICD-10-CM | POA: Diagnosis not present

## 2024-02-19 DIAGNOSIS — E785 Hyperlipidemia, unspecified: Secondary | ICD-10-CM

## 2024-02-19 NOTE — Patient Instructions (Signed)
 Continue your medications.  Follow up annually (1 year).

## 2024-02-19 NOTE — Assessment & Plan Note (Addendum)
 Patient states that he is slowly improving.  Now seeing a therapist as well.  Follows with psychiatry.  Recent note from psychiatry reviewed today.  Most recent labs reviewed.  Labs notable for mild hyperlipidemia.  Patient will continue his current medications.  No side effects at this time.

## 2024-02-19 NOTE — Assessment & Plan Note (Signed)
 Mild.  Will continue to monitor.  Needs weight loss/dietary change.

## 2024-02-19 NOTE — Progress Notes (Signed)
 Subjective:  Patient ID: Reginald Ross, male    DOB: 1996-06-29  Age: 28 y.o. MRN: [id number]  CC:   Chief Complaint  Patient presents with   Follow-up    No concerns voiced    HPI:  28 year old male with limited medical problems presents for follow-up.  Follows with psychiatry.  Overall improving.  Compliant with medication.  No adverse side effects.  Still having difficulty with obsessive behaviors.  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 06/14/2023   Autism 03/31/2022   OCD (obsessive compulsive disorder) 03/31/2022    Social Hx   Social History   Socioeconomic History   Marital status: Single    Spouse name: Not on file   Number of children: Not on file   Years of education: Not on file   Highest education level: Not on file  Occupational History   Not on file  Tobacco Use   Smoking status: Never   Smokeless tobacco: Never   Tobacco comments:    Tried smoking once but did not like it  Substance and Sexual Activity   Alcohol use: Not Currently    Comment: Tried once and did not like it   Drug use: Never   Sexual activity: Not on file  Other Topics Concern   Not on file  Social History Narrative   Not on file   Social Drivers of Health   Financial Resource Strain: Not on file  Food Insecurity: Not on file  Transportation Needs: Not on file  Physical Activity: Not on file  Stress: Not on file  Social Connections: Not on file    Review of Systems Per HPI  Objective:  BP 118/78   Pulse 78   Temp (!) 97.1 F (36.2 C)   Ht 5\' 10"  (1.778 m)   Wt 219 lb (99.3 kg)   SpO2 97%   BMI 31.42 kg/m       02/19/2024    8:10 AM 09/18/2023    9:42 AM 08/21/2023    8:19 AM  BP/Weight  Systolic BP 118 121 114  Diastolic BP 78 80 77  Wt. (Lbs) 219 199 192  BMI 31.42 kg/m2 28.55 kg/m2 27.55 kg/m2    Physical Exam Vitals and nursing note reviewed.  Constitutional:      General: He is not in acute distress.    Appearance: Normal appearance. He is obese.  HENT:     Head: Normocephalic and atraumatic.  Eyes:     General:        Right eye: No discharge.        Left eye: No discharge.     Conjunctiva/sclera: Conjunctivae normal.  Cardiovascular:     Rate and Rhythm: Normal rate and regular rhythm.  Pulmonary:     Effort: Pulmonary effort is normal.     Breath sounds: Normal breath sounds. No wheezing, rhonchi or rales.  Neurological:     Mental Status: He is alert.     Lab Results  Component Value Date   WBC 9.3 08/21/2023   HGB 13.9 08/21/2023   HCT 43.5 08/21/2023   PLT 294 08/21/2023   GLUCOSE 95 08/21/2023   CHOL 213 (H)  08/21/2023   TRIG 144 08/21/2023   HDL 59 08/21/2023   LDLCALC 129 (H) 08/21/2023   ALT 22 08/21/2023   AST 20 08/21/2023   NA 142 08/21/2023   K 3.9 08/21/2023   CL 101 08/21/2023   CREATININE 0.86 08/21/2023   BUN 12 08/21/2023   CO2 25 08/21/2023   HGBA1C 5.6 08/21/2023     Assessment & Plan:  Mixed obsessional thoughts and acts Assessment & Plan: Patient states that he is slowly improving.  Now seeing a therapist as well.  Follows with psychiatry.  Recent note from psychiatry reviewed today.  Most recent labs reviewed.  Labs notable for mild hyperlipidemia.  Patient will continue his current medications.  No side effects at this time.   Hyperlipidemia, unspecified hyperlipidemia type Assessment & Plan: Mild.  Will continue to monitor.  Needs weight loss/dietary change.    Follow-up: Annually  Kimber Esterly DO [facility]

## 2024-03-06 ENCOUNTER — Telehealth: Payer: Self-pay | Admitting: Family Medicine

## 2024-03-06 NOTE — Telephone Encounter (Signed)
   Copied from CRM [id number]. Topic: Referral - Question >> Mar 05, 2024  9:08 AM Lorenz Romano B wrote: Reason for CRM: patient mom is calling to get information about referral for psychiatrist please give mom a call back [phone]   Aaron Aas At this time I do not see a New Referral for Psychiatry .Aaron Aas Please Advise.

## 2024-04-02 ENCOUNTER — Ambulatory Visit: Payer: MEDICAID | Admitting: Family Medicine

## 2024-04-07 ENCOUNTER — Ambulatory Visit (INDEPENDENT_AMBULATORY_CARE_PROVIDER_SITE_OTHER): Payer: MEDICAID | Admitting: Family Medicine

## 2024-04-07 VITALS — BP 120/78 | HR 63 | Temp 98.3°F | Ht 70.0 in | Wt 222.0 lb

## 2024-04-07 DIAGNOSIS — E785 Hyperlipidemia, unspecified: Secondary | ICD-10-CM

## 2024-04-07 DIAGNOSIS — F422 Mixed obsessional thoughts and acts: Secondary | ICD-10-CM

## 2024-04-07 NOTE — Patient Instructions (Signed)
 Take your medicine.   Follow up in 1 year.

## 2024-04-07 NOTE — Assessment & Plan Note (Signed)
 Prior labs reviewed.  Needs dietary changes and weight loss.  Will continue to monitor closely.

## 2024-04-07 NOTE — Progress Notes (Signed)
 Subjective:  Patient ID: Reginald Ross, male    DOB: [birth year]  Age: 28 y.o. MRN: [id number]  CC:   Chief Complaint  Patient presents with   Follow-up    HPI:  28 year old male presents for reported follow-up.  He is unsure why he is here.  He is not currently taking his medications.  He is managed primarily by psychiatry.  He states that he has ongoing issues with anxiety and OCD.  He has no other complaints or concerns at this time.  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 06/14/2023   Autism 03/31/2022   OCD (obsessive compulsive disorder) 03/31/2022    Social Hx   Social History   Socioeconomic History   Marital status: Single    Spouse name: Not on file   Number of children: Not on file   Years of education: Not on file   Highest education level: Not on file  Occupational History   Not on file  Tobacco Use   Smoking status: Never   Smokeless tobacco: Never   Tobacco comments:    Tried smoking once but did not like it  Substance and Sexual Activity   Alcohol use: Not Currently    Comment: Tried once and did not like it   Drug use: Never   Sexual activity: Not on file  Other Topics Concern   Not on file  Social History Narrative   Not on file   Social Drivers of Health   Financial Resource Strain: Not on file  Food Insecurity: Not on file  Transportation Needs: Not on file  Physical Activity: Not on file  Stress: Not on file  Social Connections: Not on file    Review of Systems Per HPI  Objective:  BP 120/78   Pulse 63   Temp 98.3 F (36.8 C)   Ht 5' 10 (1.778 m)   Wt 222 lb (100.7 kg)    SpO2 98%   BMI 31.85 kg/m      04/07/2024    1:14 PM 02/19/2024    8:10 AM 09/18/2023    9:42 AM  BP/Weight  Systolic BP 120 118 121  Diastolic BP 78 78 80  Wt. (Lbs) 222 219 199  BMI 31.85 kg/m2 31.42 kg/m2 28.55 kg/m2    Physical Exam Vitals and nursing note reviewed.  Constitutional:      General: He is not in acute distress.    Appearance: Normal appearance. He is obese.  HENT:     Head: Normocephalic and atraumatic.   Eyes:     General:        Right eye: No discharge.        Left eye: No discharge.     Conjunctiva/sclera: Conjunctivae normal.    Cardiovascular:     Rate and Rhythm: Normal rate and regular rhythm.  Pulmonary:     Effort: Pulmonary effort is normal.     Breath sounds: Normal breath sounds. No wheezing, rhonchi or rales.   Neurological:     Mental Status: He is alert.   Psychiatric:        Mood and Affect: Mood normal.     Lab Results  Component  Value Date   WBC 9.3 08/21/2023   HGB 13.9 08/21/2023   HCT 43.5 08/21/2023   PLT 294 08/21/2023   GLUCOSE 95 08/21/2023   CHOL 213 (H) 08/21/2023   TRIG 144 08/21/2023   HDL 59 08/21/2023   LDLCALC 129 (H) 08/21/2023   ALT 22 08/21/2023   AST 20 08/21/2023   NA 142 08/21/2023   K 3.9 08/21/2023   CL 101 08/21/2023   CREATININE 0.86 08/21/2023   BUN 12 08/21/2023   CO2 25 08/21/2023   HGBA1C 5.6 08/21/2023     Assessment & Plan:  Mixed obsessional thoughts and acts Assessment & Plan: Advised to be compliant with medications.  Follow-up with psychiatry.   Hyperlipidemia, unspecified hyperlipidemia type Assessment & Plan: Prior labs reviewed.  Needs dietary changes and weight loss.  Will continue to monitor closely.     Follow-up:  Return in about 1 year (around 04/07/2025).  Jacqulyn Ahle DO [facility]

## 2024-04-07 NOTE — Assessment & Plan Note (Signed)
 Advised to be compliant with medications.  Follow-up with psychiatry.

## 2024-04-15 ENCOUNTER — Telehealth: Payer: Self-pay

## 2024-04-15 NOTE — Telephone Encounter (Signed)
 Communication  Reason for CRM: Rock Bars, the patient's grandmother, states that the patient requested a Psychiatry referral from Dr. Bluford during his appointment on 6/23. The patient also requested a referral on 6/11 but the note states Layman Frieze  03/26/2024  9:21 AM Type: General Please send message to clinical staff to forward to provider for review. At their request, I am sending the referral question to the clinical staff for review as I do not see the referral reflected in the patient's chart. Her call back number is [phone] and she is requesting a call back by the end of the day today.

## 2024-04-21 ENCOUNTER — Other Ambulatory Visit: Payer: Self-pay

## 2024-04-21 DIAGNOSIS — F321 Major depressive disorder, single episode, moderate: Secondary | ICD-10-CM

## 2024-04-21 DIAGNOSIS — F410 Panic disorder [episodic paroxysmal anxiety] without agoraphobia: Secondary | ICD-10-CM

## 2024-04-21 DIAGNOSIS — F422 Mixed obsessional thoughts and acts: Secondary | ICD-10-CM

## 2024-04-21 DIAGNOSIS — F840 Autistic disorder: Secondary | ICD-10-CM

## 2024-05-30 ENCOUNTER — Telehealth: Payer: Self-pay | Admitting: *Deleted

## 2024-05-30 NOTE — Telephone Encounter (Unsigned)
 Copied from CRM #[id number]. Topic: Referral - Question >> May 30, 2024  8:44 AM Wess RAMAN wrote: Reason for CRM: Patient's grandmother, Rock, stated she is unable to get in contact with [facility] for patient's referral. She would like it switched to a different location in the [place] area.  Callback #: [phone]

## 2024-06-01 ENCOUNTER — Other Ambulatory Visit: Payer: Self-pay | Admitting: Family Medicine

## 2024-06-02 ENCOUNTER — Other Ambulatory Visit: Payer: Self-pay

## 2024-06-02 DIAGNOSIS — F321 Major depressive disorder, single episode, moderate: Secondary | ICD-10-CM

## 2024-06-02 DIAGNOSIS — F410 Panic disorder [episodic paroxysmal anxiety] without agoraphobia: Secondary | ICD-10-CM

## 2024-06-02 NOTE — Telephone Encounter (Signed)
 Referral placed as indicated in notes.

## 2024-06-02 NOTE — Telephone Encounter (Signed)
 Cook, Jayce G, DO     06/01/24  9:29 PM Please place referral to psychiatry in [place].
